# Patient Record
Sex: Male | Born: 1985 | Hispanic: No | Marital: Married | Smoking: Current every day smoker
Health system: Southern US, Community
[De-identification: ages and names within clinical notes are randomized; demographics above are authoritative.]

## PROBLEM LIST (undated history)

## (undated) DIAGNOSIS — D509 Iron deficiency anemia, unspecified: Secondary | ICD-10-CM

## (undated) DIAGNOSIS — R509 Fever, unspecified: Secondary | ICD-10-CM

## (undated) HISTORY — PX: NO PAST SURGERIES: SHX2092

---

## 2016-12-02 DIAGNOSIS — R509 Fever, unspecified: Secondary | ICD-10-CM

## 2016-12-02 HISTORY — DX: Fever, unspecified: R50.9

## 2016-12-28 ENCOUNTER — Inpatient Hospital Stay (HOSPITAL_COMMUNITY)
Admission: EM | Admit: 2016-12-28 | Discharge: 2017-01-02 | DRG: 871 | Disposition: A | Discharge status: Home / Self Care | Payer: Self-pay | Attending: Family Medicine | Admitting: Family Medicine

## 2016-12-28 ENCOUNTER — Encounter (HOSPITAL_COMMUNITY): Payer: Self-pay | Admitting: Emergency Medicine

## 2016-12-28 ENCOUNTER — Emergency Department (HOSPITAL_COMMUNITY): Payer: Self-pay

## 2016-12-28 DIAGNOSIS — D509 Iron deficiency anemia, unspecified: Secondary | ICD-10-CM | POA: Diagnosis present

## 2016-12-28 DIAGNOSIS — A419 Sepsis, unspecified organism: Principal | ICD-10-CM | POA: Diagnosis present

## 2016-12-28 DIAGNOSIS — R1010 Upper abdominal pain, unspecified: Secondary | ICD-10-CM

## 2016-12-28 DIAGNOSIS — R6521 Severe sepsis with septic shock: Secondary | ICD-10-CM | POA: Diagnosis present

## 2016-12-28 DIAGNOSIS — E871 Hypo-osmolality and hyponatremia: Secondary | ICD-10-CM | POA: Diagnosis present

## 2016-12-28 DIAGNOSIS — R110 Nausea: Secondary | ICD-10-CM

## 2016-12-28 DIAGNOSIS — R634 Abnormal weight loss: Secondary | ICD-10-CM | POA: Diagnosis present

## 2016-12-28 DIAGNOSIS — B600 Babesiosis: Secondary | ICD-10-CM | POA: Diagnosis present

## 2016-12-28 DIAGNOSIS — D696 Thrombocytopenia, unspecified: Secondary | ICD-10-CM | POA: Diagnosis present

## 2016-12-28 DIAGNOSIS — Z681 Body mass index (BMI) 19 or less, adult: Secondary | ICD-10-CM

## 2016-12-28 DIAGNOSIS — F17210 Nicotine dependence, cigarettes, uncomplicated: Secondary | ICD-10-CM | POA: Diagnosis present

## 2016-12-28 DIAGNOSIS — E876 Hypokalemia: Secondary | ICD-10-CM | POA: Diagnosis present

## 2016-12-28 DIAGNOSIS — G4700 Insomnia, unspecified: Secondary | ICD-10-CM | POA: Diagnosis present

## 2016-12-28 DIAGNOSIS — R630 Anorexia: Secondary | ICD-10-CM | POA: Diagnosis present

## 2016-12-28 DIAGNOSIS — K859 Acute pancreatitis without necrosis or infection, unspecified: Secondary | ICD-10-CM

## 2016-12-28 DIAGNOSIS — R509 Fever, unspecified: Secondary | ICD-10-CM | POA: Diagnosis present

## 2016-12-28 DIAGNOSIS — I959 Hypotension, unspecified: Secondary | ICD-10-CM | POA: Diagnosis present

## 2016-12-28 HISTORY — DX: Fever, unspecified: R50.9

## 2016-12-28 HISTORY — DX: Iron deficiency anemia, unspecified: D50.9

## 2016-12-28 LAB — CBC WITH DIFFERENTIAL/PLATELET
BASOS ABS: 0 10*3/uL (ref 0.0–0.1)
BASOS PCT: 0 %
Eosinophils Absolute: 0 10*3/uL (ref 0.0–0.7)
Eosinophils Relative: 1 %
HEMATOCRIT: 34.2 % — AB (ref 39.0–52.0)
HEMOGLOBIN: 11.6 g/dL — AB (ref 13.0–17.0)
Lymphocytes Relative: 10 %
Lymphs Abs: 0.4 10*3/uL — ABNORMAL LOW (ref 0.7–4.0)
MCH: 25.8 pg — ABNORMAL LOW (ref 26.0–34.0)
MCHC: 33.9 g/dL (ref 30.0–36.0)
MCV: 76.2 fL — ABNORMAL LOW (ref 78.0–100.0)
Monocytes Absolute: 0.3 10*3/uL (ref 0.1–1.0)
Monocytes Relative: 7 %
NEUTROS ABS: 3.4 10*3/uL (ref 1.7–7.7)
NEUTROS PCT: 82 %
Platelets: 107 10*3/uL — ABNORMAL LOW (ref 150–400)
RBC: 4.49 MIL/uL (ref 4.22–5.81)
RDW: 15.6 % — ABNORMAL HIGH (ref 11.5–15.5)
WBC: 4.2 10*3/uL (ref 4.0–10.5)

## 2016-12-28 LAB — COMPREHENSIVE METABOLIC PANEL
ALBUMIN: 3.5 g/dL (ref 3.5–5.0)
ALT: 36 U/L (ref 17–63)
ANION GAP: 9 (ref 5–15)
AST: 36 U/L (ref 15–41)
Alkaline Phosphatase: 65 U/L (ref 38–126)
BILIRUBIN TOTAL: 1.9 mg/dL — AB (ref 0.3–1.2)
BUN: 15 mg/dL (ref 6–20)
CO2: 20 mmol/L — ABNORMAL LOW (ref 22–32)
Calcium: 8.8 mg/dL — ABNORMAL LOW (ref 8.9–10.3)
Chloride: 102 mmol/L (ref 101–111)
Creatinine, Ser: 1.11 mg/dL (ref 0.61–1.24)
GFR calc Af Amer: >60 mL/min (ref >60)
GFR calc non Af Amer: >60 mL/min (ref >60)
GLUCOSE: 105 mg/dL — AB (ref 65–99)
POTASSIUM: 3.3 mmol/L — AB (ref 3.5–5.1)
Sodium: 131 mmol/L — ABNORMAL LOW (ref 135–145)
TOTAL PROTEIN: 6.5 g/dL (ref 6.5–8.1)

## 2016-12-28 LAB — URINALYSIS, ROUTINE W REFLEX MICROSCOPIC
Bilirubin Urine: NEGATIVE
Glucose, UA: NEGATIVE mg/dL
Hgb urine dipstick: NEGATIVE
KETONES UR: 5 mg/dL — AB
LEUKOCYTES UA: NEGATIVE
Nitrite: NEGATIVE
PROTEIN: NEGATIVE mg/dL
Specific Gravity, Urine: 1.009 (ref 1.005–1.030)
pH: 7 (ref 5.0–8.0)

## 2016-12-28 LAB — PROTIME-INR
INR: 1.19
Prothrombin Time: 15.1 seconds (ref 11.4–15.2)

## 2016-12-28 LAB — I-STAT CG4 LACTIC ACID, ED
LACTIC ACID, VENOUS: 1.3 mmol/L (ref 0.5–1.9)
LACTIC ACID, VENOUS: 1.56 mmol/L (ref 0.5–1.9)
LACTIC ACID, VENOUS: 0.83 mmol/L (ref 0.5–1.9)

## 2016-12-28 LAB — LIPASE, BLOOD: Lipase: 25 U/L (ref 11–51)

## 2016-12-28 MED ORDER — PIPERACILLIN-TAZOBACTAM 3.375 G IVPB
3.3750 g | Freq: Three times a day (TID) | INTRAVENOUS | Status: DC
  Administered 2016-12-29: 3.375 g via INTRAVENOUS
  Filled 2016-12-28: qty 50

## 2016-12-28 MED ORDER — VANCOMYCIN HCL IN DEXTROSE 750-5 MG/150ML-% IV SOLN
750.0000 mg | Freq: Two times a day (BID) | INTRAVENOUS | Status: DC
  Administered 2016-12-29 (×2): 750 mg via INTRAVENOUS
  Filled 2016-12-28 (×3): qty 150

## 2016-12-28 MED ORDER — VANCOMYCIN HCL 10 G IV SOLR
1500.0000 mg | Freq: Once | INTRAVENOUS | Status: AC
  Administered 2016-12-28: 1500 mg via INTRAVENOUS
  Filled 2016-12-28: qty 1500

## 2016-12-28 MED ORDER — VANCOMYCIN HCL IN DEXTROSE 1-5 GM/200ML-% IV SOLN
1000.0000 mg | Freq: Once | INTRAVENOUS | Status: DC
  Filled 2016-12-28: qty 200

## 2016-12-28 MED ORDER — SODIUM CHLORIDE 0.9 % IV BOLUS (SEPSIS)
1000.0000 mL | Freq: Once | INTRAVENOUS | Status: AC
  Administered 2016-12-28: 1000 mL via INTRAVENOUS

## 2016-12-28 MED ORDER — PIPERACILLIN-TAZOBACTAM 3.375 G IVPB 30 MIN
3.3750 g | Freq: Once | INTRAVENOUS | Status: AC
  Administered 2016-12-28: 3.375 g via INTRAVENOUS
  Filled 2016-12-28: qty 50

## 2016-12-28 MED ORDER — ACETAMINOPHEN 500 MG PO TABS
1000.0000 mg | ORAL_TABLET | Freq: Once | ORAL | Status: AC
  Administered 2016-12-28: 1000 mg via ORAL
  Filled 2016-12-28: qty 2

## 2016-12-28 MED ORDER — POTASSIUM CHLORIDE CRYS ER 20 MEQ PO TBCR
40.0000 meq | EXTENDED_RELEASE_TABLET | ORAL | Status: AC
  Administered 2016-12-28: 40 meq via ORAL
  Filled 2016-12-28: qty 2

## 2016-12-28 MED ORDER — SODIUM CHLORIDE 0.9 % IV BOLUS (SEPSIS)
250.0000 mL | Freq: Once | INTRAVENOUS | Status: AC
  Administered 2016-12-28: 250 mL via INTRAVENOUS

## 2016-12-28 NOTE — ED Triage Notes (Signed)
Pt presents c/o fevers and chills and weakness since Saturday; pt also endorses nausea; appears weak in triage unable to hold himself up, visible beads of sweat on forehead

## 2016-12-28 NOTE — ED Provider Notes (Signed)
Medical screening examination/treatment/procedure(s) were conducted as a shared visit with non-physician practitioner(s) and myself.  I personally evaluated the patient during the encounter.   EKG Interpretation None     Patient reports he has felt very ill starting new for yesterday. He started getting chills and sweats. He reports he has felt generally very weak. He denies focal abdominal pain, chest pain, urinary symptoms, rashes or tick bites. Patient endorses generalized headache no sore throat or earache. Patient denies any travel outside the [place]. He has been living in [place] for 2 years. Patient is alert and appropriate. He does not have respiratory distress. He appears moderately ill and uncomfortable. Oral cavity is pink and moist posterior oropharynx widely patent. Bilateral TMs normal. Neck is supple. Heart regular. Abdomen soft nontender. Lower extremity swelling. No joint effusions. No rash. Sepsis protocol initiated. At this time a source of infection uncertain. I agree with plan of management.   Arby Barrette, MD 12/28/16 2114

## 2016-12-28 NOTE — Progress Notes (Signed)
Pharmacy Antibiotic Note  George Murillo is a 31 y.o. male admitted on 12/28/2016 with sepsis.  Pharmacy has been consulted for vancomycin and Zosyn dosing.   Labs are now back- SCr 1.11/CrCl ~61 ml/min  Plan: Will schedule maintenance doses: Zosyn 3.375g IV every 8 hours - 4 hour infusion. Vancomycin 750mg  IV every 12 hours.  Monitor renal function, culture results, and clinical status.   Weight: 149 lb 14.6 oz (68 kg)  Temp (24hrs), Avg:101.4 F (38.6 C), Min:101.4 F (38.6 C), Max:101.4 F (38.6 C)   Recent Labs Lab 12/28/16 2018 12/28/16 2025 12/28/16 2111  WBC 4.2  --   --   CREATININE 1.11  --   --   LATICACIDVEN  --  1.30 1.56    CrCl cannot be calculated (Unknown ideal weight.).    No Known Allergies  Antimicrobials this admission: Vancomycin 8/27>> Zosyn 8/27>>  Microbiology results: 8/27 BCx:  8/27 UCx:   Thank you for allowing pharmacy to be a part of this patient's care.  Link Snuffer, PharmD, BCPS Clinical Pharmacist Clinical phone 12/28/2016 until 11PM [phone] After hours, please call #[phone] 12/28/2016 10:06 PM

## 2016-12-28 NOTE — Progress Notes (Signed)
Pharmacy Antibiotic Note  George Murillo is a 31 y.o. male admitted on 12/28/2016 with sepsis.  Pharmacy has been consulted for vancomycin and zosyn dosing. Patient is febrile. WBC count is low at 4.2 and lactate is not elevated.  Creatinine and other cultures are pending. Will follow-up with labs when result for maintenance dosing, initial and loading doses entered for now.   Plan: Vancomycin 1500 mg IV x 1 dose  Zosyn 3.375 gram IV x 1 dose  Follow cultures, renal function, LOT, and vancomycin troughs as indicated   Weight: 149 lb 14.6 oz (68 kg)  Temp (24hrs), Avg:101.4 F (38.6 C), Min:101.4 F (38.6 C), Max:101.4 F (38.6 C)   Recent Labs Lab 12/28/16 2018 12/28/16 2025  WBC 4.2  --   LATICACIDVEN  --  1.30    CrCl cannot be calculated (No order found.).    No Known Allergies  Antimicrobials this admission: Vancomycin 8/27>> Zosyn 8/27>>  Microbiology results: 8/27 BCx:  8/27 UCx:   Thank you for allowing pharmacy to be a part of this patient's care.  Blake Divine, Pharm.D. PGY1 Pharmacy Resident 12/28/2016 8:59 PM Main Pharmacy: [phone]

## 2016-12-28 NOTE — H&P (Addendum)
History and Physical    George Murillo BJY:[id number] DOB: 11-Jun-1985 DOA: 12/28/2016  Referring MD/NP/PA:Joshua Daisy Lazar PCP: Patient, No Pcp Per  Patient coming from: home  Chief Complaint: Fever and chills  HPI: George Murillo is a 31 y.o. male without significant medical history; who presents with complaints of fever and chills over the last 4 days. Patient is originally from [place], but has lived in the [place] for over 2 years. He reports having subjective fevers at home and severe cold chills with whole body shaking. Symptoms appear to waxed and waned in intensity. He tried taking cold showers and applying cool rags to his forehead with only minimal relief of symptoms. Did not try any over-the-counter medications or home remedies. Only associated symptoms that he complains of his right sided headache, body aches, and a mild dry cough. He reports no recent sick contacts, travel outside of the [place], sore throat, nasal congestion, shortness of breath, nausea, vomiting, abdominal pain, diarrhea, or dysuria. He does not smoke tobacco, drink any alcohol, or utilize any illicit drugs.  ED Course: Upon admission into the emergency department patient was seen to be febrile up to 101.48F, pulse 104-115, respirations 22-28, blood pressures as low as 85/5. The patient's initial presentation sepsis protocol was initiated with. Antibiotics of vancomycin and Zosyn and the patient was given a full fluid bolus. Despite 2.25 L of fluid blood pressures remain low and patient was ordered to begin additional 1 L of fluid. Labs revealed WBC 4.2, hemoglobin 11.6, glucose 107, sodium 131, potassium 3.3. Chest x-ray and urinalysis were negative for any signs of infection.  Review of Systems: Review of Systems  Constitutional: Positive for chills, fever, malaise/fatigue and weight loss.  HENT: Negative for congestion, ear discharge and nosebleeds.   Eyes: Negative for double vision and photophobia.  Respiratory: Positive  for cough. Negative for shortness of breath.   Cardiovascular: Negative for chest pain and leg swelling.  Gastrointestinal: Negative for abdominal pain, diarrhea, nausea and vomiting.  Genitourinary: Negative for dysuria and frequency.  Musculoskeletal: Positive for myalgias.  Skin: Negative for itching and rash.  Neurological: Positive for weakness. Negative for speech change and focal weakness.  Psychiatric/Behavioral: Negative for substance abuse and suicidal ideas.    History reviewed. No pertinent past medical history.  History reviewed. No pertinent surgical history.   reports that he has been smoking Cigarettes.  He has been smoking about 0.50 packs per day. He does not have any smokeless tobacco history on file. He reports that he does not drink alcohol. His drug history is not on file.  No Known Allergies  Family History  Problem Relation Age of Onset  . Heart disease Neg Hx     Prior to Admission medications   Not on File    Physical Exam:  Constitutional: Sick appearing young male  Vitals:   12/28/16 2100 12/28/16 2212 12/28/16 2230 12/28/16 2316  BP: (!) 94/57 (!) 88/47 (!) 87/50   Pulse: (!) 109 (!) 113 (!) 106   Resp: (!) 28 (!) 28 (!) 26   Temp:    98.4 F (36.9 C)  TempSrc:    Oral  SpO2: 99% 100% 100%   Weight:       Eyes: PERRL, lids and conjunctivae normal ENMT: Mucous membranes are dry. Posterior pharynx clear of any exudate or lesions.   Neck: normal, supple, no masses, no thyromegaly Respiratory: Tachypneic, but clear to auscultation bilaterally, no wheezing, no crackles. Normal respiratory effort. No accessory muscle use.  Cardiovascular: tachycardic, no murmurs / rubs / gallops. No extremity edema. 2+ pedal pulses. No carotid bruits.  Abdomen: no tenderness, no masses palpated. No hepatosplenomegaly. Bowel sounds positive.  Musculoskeletal: no clubbing / cyanosis. No joint deformity upper and lower extremities. Good ROM, no contractures. Normal  muscle tone.  Skin: no rashes, lesions, ulcers. No induration Neurologic: CN 2-12 grossly intact. Sensation intact, DTR normal. Strength 5/5 in all 4.  Psychiatric: Normal judgment and insight. Alert and oriented x 3. Normal mood.     Labs on Admission: I have personally reviewed following labs and imaging studies  CBC:  Recent Labs Lab 12/28/16 2018  WBC 4.2  NEUTROABS 3.4  HGB 11.6*  HCT 34.2*  MCV 76.2*  PLT 107*   Basic Metabolic Panel:  Recent Labs Lab 12/28/16 2018  NA 131*  K 3.3*  CL 102  CO2 20*  GLUCOSE 105*  BUN 15  CREATININE 1.11  CALCIUM 8.8*   GFR: CrCl cannot be calculated (Unknown ideal weight.). Liver Function Tests:  Recent Labs Lab 12/28/16 2018  AST 36  ALT 36  ALKPHOS 65  BILITOT 1.9*  PROT 6.5  ALBUMIN 3.5    Recent Labs Lab 12/28/16 2143  LIPASE 25   No results for input(s): AMMONIA in the last 168 hours. Coagulation Profile:  Recent Labs Lab 12/28/16 2018  INR 1.19   Cardiac Enzymes: No results for input(s): CKTOTAL, CKMB, CKMBINDEX, TROPONINI in the last 168 hours. BNP (last 3 results) No results for input(s): PROBNP in the last 8760 hours. HbA1C: No results for input(s): HGBA1C in the last 72 hours. CBG: No results for input(s): GLUCAP in the last 168 hours. Lipid Profile: No results for input(s): CHOL, HDL, LDLCALC, TRIG, CHOLHDL, LDLDIRECT in the last 72 hours. Thyroid Function Tests: No results for input(s): TSH, T4TOTAL, FREET4, T3FREE, THYROIDAB in the last 72 hours. Anemia Panel: No results for input(s): VITAMINB12, FOLATE, FERRITIN, TIBC, IRON, RETICCTPCT in the last 72 hours. Urine analysis:    Component Value Date/Time   COLORURINE YELLOW 12/28/2016 2210   APPEARANCEUR CLEAR 12/28/2016 2210   LABSPEC 1.009 12/28/2016 2210   PHURINE 7.0 12/28/2016 2210   GLUCOSEU NEGATIVE 12/28/2016 2210   HGBUR NEGATIVE 12/28/2016 2210   BILIRUBINUR NEGATIVE 12/28/2016 2210   KETONESUR 5 (A) 12/28/2016 2210    PROTEINUR NEGATIVE 12/28/2016 2210   NITRITE NEGATIVE 12/28/2016 2210   LEUKOCYTESUR NEGATIVE 12/28/2016 2210   Sepsis Labs: No results found for this or any previous visit (from the past 240 hour(s)).   Radiological Exams on Admission: Dg Chest 2 View  Result Date: 12/28/2016 CLINICAL DATA:  Fever chills and weakness for 2 days. EXAM: CHEST  2 VIEW COMPARISON:  None. FINDINGS: The cardiomediastinal silhouette is unremarkable. There is no evidence of focal airspace disease, pulmonary edema, suspicious pulmonary nodule/mass, pleural effusion, or pneumothorax. No acute bony abnormalities are identified. IMPRESSION: No active cardiopulmonary disease. Electronically Signed   By: Harmon Pier M.D.   On: 12/28/2016 22:17    EKG: Independently reviewed. Sinus tachycardia rate 116 bpm  Assessment/Plan Fever of unknown origin/ SIRS Acute. Patient presents febrile up to 101.71F with tachycardia and tachypnea - Admit to a stepdown bed( downgraded to telemetry bed after blood pressure stabilized) - Follow-up respiratory virus panel and blood cultures - continue antibiotics of vancomycin and Zosyn  Hypotension: Acute. Blood pressures low as 87/50 despite 2.25 L of IV fluids. Patient being given an additional 1 L of IV fluids. -  IV fluids of normal saline  at 125 ml/hr - check Cortisol level in a.m.  Hypokalemia: Acute. Initial potassium 3.3 on admission. - Give 40 mEq of potassium chloride 1 dose now - continue to monitor and replaced as needed  Microcytic microchromic anemia: Patient presents with hemoglobin 11.6 with low MCV and MCH. RDW noted to be elevated at 15.6 which also suggests signs of iron deficiency. - Check iron and TIBC in a.m. - recheck CBC  Hyponatremia: Acute. Initial sodium 131 suspect secondary to losses with elevated fevers. - Continue replacement IV fluids - Recheck BMP in a.m.   DVT prophylaxis: lovenox, and will discontinue if platlets drop below 100k.   Code Status:  Full Family Communication: Discussed plan of care with the patient and family present at bedside Disposition Plan: Likely discharge home in 2-3 days once medically stable Consults called: none  Admission status: inpatient  Clydie Braun MD [facility] Pager [phone]   If 7PM-7AM, please contact night-coverage [email or web address] Password [facility]  12/28/2016, 11:21 PM

## 2016-12-28 NOTE — ED Notes (Signed)
EDP at bedside. [place]

## 2016-12-28 NOTE — ED Notes (Signed)
Pt transported to Xray. 

## 2016-12-28 NOTE — ED Notes (Signed)
EDP at bedside. [place]

## 2016-12-28 NOTE — ED Provider Notes (Signed)
[facility]-EMERGENCY DEPT Provider Note   CSN: [id number] Arrival date & time: 12/28/16  1914     History   Chief Complaint Chief Complaint  Patient presents with  . Fever  . Weakness  . Shortness of Breath    HPI George Murillo is a 31 y.o. male.  Patient presents with complaint of fever, weakness for the past 4 days. He has had chills as well. He is from the [place] and has been in the [place] for 2 years. Denies previous illnesses. No vaccines as a child. No known sick contacts. Patient denies URI symptoms such as nasal congestion and sore throat. No significant cough or shortness of breath. He has had nausea but no vomiting or diarrhea. No abdominal pains. No urinary symptoms. No skin rashes or abscesses denies recent tick bites. Patient lives at home with his wife and children who have not been sick. He works in a [employer]. He denies alcohol or drug abuse. No treatments prior to arrival. The onset of this condition was acute. The course is constant. Aggravating factors: none. Alleviating factors: none.        History reviewed. No pertinent past medical history.  There are no active problems to display for this patient.   History reviewed. No pertinent surgical history.     Home Medications    Prior to Admission medications   Not on File    Family History History reviewed. No pertinent family history.  Social History Social History  Substance Use Topics  . Smoking status: Current Every Day Smoker    Packs/day: 0.50    Types: Cigarettes  . Smokeless tobacco: Not on file  . Alcohol use No     Allergies   Patient has no known allergies.   Review of Systems Review of Systems  Constitutional: Positive for chills and fever.  HENT: Negative for congestion, rhinorrhea and sore throat.   Eyes: Negative for redness.  Respiratory: Negative for cough, shortness of breath and wheezing.   Cardiovascular: Negative for chest pain.  Gastrointestinal: Positive for  nausea. Negative for abdominal pain, diarrhea and vomiting.  Genitourinary: Negative for dysuria and hematuria.  Musculoskeletal: Negative for back pain and myalgias.  Skin: Negative for color change and rash.  Neurological: Positive for weakness. Negative for headaches.     Physical Exam Updated Vital Signs BP (!) 86/51   Pulse (!) 115   Temp (!) 101.4 F (38.6 C) (Oral)   Resp (!) 24   Wt 68 kg (149 lb 14.6 oz)   SpO2 100%   Physical Exam  Constitutional: He appears well-developed and well-nourished.  Ill-appearing but nontoxic.  HENT:  Head: Normocephalic and atraumatic.  Nose: Nose normal.  Mouth/Throat: Oropharynx is clear and moist.  Eyes: Conjunctivae are normal. Right eye exhibits no discharge. Left eye exhibits no discharge.  Neck: Normal range of motion. Neck supple.  Cardiovascular: Regular rhythm and normal heart sounds.  Tachycardia present.   Pulmonary/Chest: Effort normal and breath sounds normal. No respiratory distress. He has no wheezes. He has no rales.  Abdominal: Soft. There is no tenderness. There is no rebound and no guarding.  Musculoskeletal: He exhibits no edema.  Generalized weakness, no focal deficits.  Lymphadenopathy:    He has no cervical adenopathy.  Neurological: He is alert.  Skin: Skin is warm and dry. No rash noted.  No identifiable rashes or skin changes.  Psychiatric: He has a normal mood and affect.  Nursing note and vitals reviewed.    ED  Treatments / Results  Labs (all labs ordered are listed, but only abnormal results are displayed) Labs Reviewed  COMPREHENSIVE METABOLIC PANEL - Abnormal; Notable for the following:       Result Value   Sodium 131 (*)    Potassium 3.3 (*)    CO2 20 (*)    Glucose, Bld 105 (*)    Calcium 8.8 (*)    Total Bilirubin 1.9 (*)    All other components within normal limits  CBC WITH DIFFERENTIAL/PLATELET - Abnormal; Notable for the following:    Hemoglobin 11.6 (*)    HCT 34.2 (*)    MCV 76.2  (*)    MCH 25.8 (*)    RDW 15.6 (*)    Platelets 107 (*)    Lymphs Abs 0.4 (*)    All other components within normal limits  URINALYSIS, ROUTINE W REFLEX MICROSCOPIC - Abnormal; Notable for the following:    Ketones, ur 5 (*)    All other components within normal limits  CULTURE, BLOOD (ROUTINE X 2)  CULTURE, BLOOD (ROUTINE X 2)  URINE CULTURE  PROTIME-INR  LIPASE, BLOOD  URINALYSIS, COMPLETE (UACMP) WITH MICROSCOPIC  RAPID HIV SCREEN (HIV 1/2 AB+AG)  RAPID URINE DRUG SCREEN, HOSP PERFORMED  TSH  I-STAT CG4 LACTIC ACID, ED  I-STAT CG4 LACTIC ACID, ED  I-STAT CG4 LACTIC ACID, ED  I-STAT CG4 LACTIC ACID, ED    EKG  EKG Interpretation None       Radiology Dg Chest 2 View  Result Date: 12/28/2016 CLINICAL DATA:  Fever chills and weakness for 2 days. EXAM: CHEST  2 VIEW COMPARISON:  None. FINDINGS: The cardiomediastinal silhouette is unremarkable. There is no evidence of focal airspace disease, pulmonary edema, suspicious pulmonary nodule/mass, pleural effusion, or pneumothorax. No acute bony abnormalities are identified. IMPRESSION: No active cardiopulmonary disease. Electronically Signed   By: Harmon Pier M.D.   On: 12/28/2016 22:17    Procedures Procedures (including critical care time)  Medications Ordered in ED Medications  vancomycin (VANCOCIN) 1,500 mg in sodium chloride 0.9 % 500 mL IVPB (1,500 mg Intravenous New Bag/Given 12/28/16 2135)  piperacillin-tazobactam (ZOSYN) IVPB 3.375 g (not administered)  vancomycin (VANCOCIN) IVPB 750 mg/150 ml premix (not administered)  sodium chloride 0.9 % bolus 1,000 mL (1,000 mLs Intravenous New Bag/Given 12/28/16 2316)  sodium chloride 0.9 % bolus 1,000 mL (0 mLs Intravenous Stopped 12/28/16 2212)    And  sodium chloride 0.9 % bolus 1,000 mL (0 mLs Intravenous Stopped 12/28/16 2212)    And  sodium chloride 0.9 % bolus 250 mL (0 mLs Intravenous Stopped 12/28/16 2113)  piperacillin-tazobactam (ZOSYN) IVPB 3.375 g (0 g Intravenous  Stopped 12/28/16 2212)  acetaminophen (TYLENOL) tablet 1,000 mg (1,000 mg Oral Given 12/28/16 2317)     Initial Impression / Assessment and Plan / ED Course  I have reviewed the triage vital signs and the nursing notes.  Pertinent labs & imaging results that were available during my care of the patient were reviewed by me and considered in my medical decision making (see chart for details).     Patient seen and examined. Work-up initiated. Medications ordered.   Vital signs reviewed and are as follows: BP (!) 86/51   Pulse (!) 115   Temp (!) 101.4 F (38.6 C) (Oral)   Resp (!) 24   Wt 68 kg (149 lb 14.6 oz)   SpO2 100%   Patient is febrile, tachycardic, hypotensive. Sepsis bundle completed. Antibiotics ordered. Fluids ordered. Will reassess. Discussed with  Dr. Donnald Garre.   11:26 PM Patient feels better. Temp improved. Still hypotensive -- additional fluids ordered.   Spoke with Dr. Katrinka Blazing of [facility] who will see.   Final Clinical Impressions(s) / ED Diagnoses   Final diagnoses:  Fever, unspecified fever cause  Sepsis, due to unspecified organism (HCC)   Admit. Unclear cause of fever/sepsis, tachycardia, hypotension.   New Prescriptions New Prescriptions   No medications on file     Desmond Dike 12/28/16 2328    Arby Barrette, MD 12/29/16 [phone]

## 2016-12-29 ENCOUNTER — Encounter (HOSPITAL_COMMUNITY): Payer: Self-pay | Admitting: General Practice

## 2016-12-29 DIAGNOSIS — I959 Hypotension, unspecified: Secondary | ICD-10-CM | POA: Diagnosis present

## 2016-12-29 DIAGNOSIS — E871 Hypo-osmolality and hyponatremia: Secondary | ICD-10-CM | POA: Diagnosis present

## 2016-12-29 DIAGNOSIS — D509 Iron deficiency anemia, unspecified: Secondary | ICD-10-CM | POA: Diagnosis present

## 2016-12-29 DIAGNOSIS — R509 Fever, unspecified: Secondary | ICD-10-CM | POA: Diagnosis present

## 2016-12-29 LAB — CBC
HEMATOCRIT: 32.7 % — AB (ref 39.0–52.0)
Hemoglobin: 10.3 g/dL — ABNORMAL LOW (ref 13.0–17.0)
MCH: 24.4 pg — AB (ref 26.0–34.0)
MCHC: 31.5 g/dL (ref 30.0–36.0)
MCV: 77.5 fL — AB (ref 78.0–100.0)
PLATELETS: 96 10*3/uL — AB (ref 150–400)
RBC: 4.22 MIL/uL (ref 4.22–5.81)
RDW: 15.5 % (ref 11.5–15.5)
WBC: 4.9 10*3/uL (ref 4.0–10.5)

## 2016-12-29 LAB — RAPID URINE DRUG SCREEN, HOSP PERFORMED
AMPHETAMINES: NOT DETECTED
Barbiturates: NOT DETECTED
Benzodiazepines: NOT DETECTED
Cocaine: NOT DETECTED
Opiates: NOT DETECTED
Tetrahydrocannabinol: NOT DETECTED

## 2016-12-29 LAB — HEPATIC FUNCTION PANEL
ALBUMIN: 2.6 g/dL — AB (ref 3.5–5.0)
ALT: 25 U/L (ref 17–63)
AST: 25 U/L (ref 15–41)
Alkaline Phosphatase: 44 U/L (ref 38–126)
BILIRUBIN TOTAL: 0.8 mg/dL (ref 0.3–1.2)
Bilirubin, Direct: 0.2 mg/dL (ref 0.1–0.5)
Indirect Bilirubin: 0.6 mg/dL (ref 0.3–0.9)
Total Protein: 5.1 g/dL — ABNORMAL LOW (ref 6.5–8.1)

## 2016-12-29 LAB — CBC WITH DIFFERENTIAL/PLATELET
BASOS ABS: 0 10*3/uL (ref 0.0–0.1)
Basophils Relative: 1 %
EOS ABS: 0.1 10*3/uL (ref 0.0–0.7)
EOS PCT: 2 %
HCT: 31.6 % — ABNORMAL LOW (ref 39.0–52.0)
Hemoglobin: 10.2 g/dL — ABNORMAL LOW (ref 13.0–17.0)
LYMPHS PCT: 39 %
Lymphs Abs: 1.7 10*3/uL (ref 0.7–4.0)
MCH: 25 pg — ABNORMAL LOW (ref 26.0–34.0)
MCHC: 32.3 g/dL (ref 30.0–36.0)
MCV: 77.5 fL — AB (ref 78.0–100.0)
Monocytes Absolute: 0.5 10*3/uL (ref 0.1–1.0)
Monocytes Relative: 11 %
NEUTROS PCT: 48 %
Neutro Abs: 2.1 10*3/uL (ref 1.7–7.7)
PLATELETS: 88 10*3/uL — AB (ref 150–400)
RBC: 4.08 MIL/uL — ABNORMAL LOW (ref 4.22–5.81)
RDW: 15.7 % — ABNORMAL HIGH (ref 11.5–15.5)
WBC: 4.4 10*3/uL (ref 4.0–10.5)

## 2016-12-29 LAB — BASIC METABOLIC PANEL
Anion gap: 5 (ref 5–15)
BUN: 8 mg/dL (ref 6–20)
CALCIUM: 7.4 mg/dL — AB (ref 8.9–10.3)
CO2: 21 mmol/L — AB (ref 22–32)
CREATININE: 1.02 mg/dL (ref 0.61–1.24)
Chloride: 112 mmol/L — ABNORMAL HIGH (ref 101–111)
GFR calc Af Amer: >60 mL/min (ref >60)
GLUCOSE: 125 mg/dL — AB (ref 65–99)
Potassium: 3.5 mmol/L (ref 3.5–5.1)
Sodium: 138 mmol/L (ref 135–145)

## 2016-12-29 LAB — RESPIRATORY PANEL BY PCR
ADENOVIRUS-RVPPCR: NOT DETECTED
Bordetella pertussis: NOT DETECTED
CHLAMYDOPHILA PNEUMONIAE-RVPPCR: NOT DETECTED
CORONAVIRUS HKU1-RVPPCR: NOT DETECTED
CORONAVIRUS NL63-RVPPCR: NOT DETECTED
CORONAVIRUS OC43-RVPPCR: NOT DETECTED
Coronavirus 229E: NOT DETECTED
Influenza A: NOT DETECTED
Influenza B: NOT DETECTED
Metapneumovirus: NOT DETECTED
Mycoplasma pneumoniae: NOT DETECTED
PARAINFLUENZA VIRUS 1-RVPPCR: NOT DETECTED
PARAINFLUENZA VIRUS 3-RVPPCR: NOT DETECTED
Parainfluenza Virus 2: NOT DETECTED
Parainfluenza Virus 4: NOT DETECTED
RHINOVIRUS / ENTEROVIRUS - RVPPCR: NOT DETECTED
Respiratory Syncytial Virus: NOT DETECTED

## 2016-12-29 LAB — IRON AND TIBC
Iron: 13 ug/dL — ABNORMAL LOW (ref 45–182)
SATURATION RATIOS: 6 % — AB (ref 17.9–39.5)
TIBC: 200 ug/dL — AB (ref 250–450)
UIBC: 187 ug/dL

## 2016-12-29 LAB — RAPID HIV SCREEN (HIV 1/2 AB+AG)
HIV 1/2 ANTIBODIES: NONREACTIVE
HIV-1 P24 ANTIGEN - HIV24: NONREACTIVE

## 2016-12-29 LAB — CORTISOL-AM, BLOOD: CORTISOL - AM: 8.8 ug/dL (ref 6.7–22.6)

## 2016-12-29 LAB — TSH: TSH: 1.172 u[IU]/mL (ref 0.350–4.500)

## 2016-12-29 MED ORDER — HYDROCORTISONE NA SUCCINATE PF 100 MG IJ SOLR
100.0000 mg | Freq: Three times a day (TID) | INTRAMUSCULAR | Status: DC
  Administered 2016-12-29 – 2016-12-30 (×3): 100 mg via INTRAVENOUS
  Filled 2016-12-29 (×4): qty 2

## 2016-12-29 MED ORDER — ONDANSETRON HCL 4 MG/2ML IJ SOLN
4.0000 mg | Freq: Four times a day (QID) | INTRAMUSCULAR | Status: DC | PRN
  Administered 2017-01-01: 4 mg via INTRAVENOUS
  Filled 2016-12-29: qty 2

## 2016-12-29 MED ORDER — ACETAMINOPHEN 650 MG RE SUPP: 650.0000 mg | Freq: Four times a day (QID) | RECTAL | Status: DC | PRN

## 2016-12-29 MED ORDER — SODIUM CHLORIDE 0.9 % IV SOLN
INTRAVENOUS | Status: DC
  Administered 2016-12-29: 22:00:00 via INTRAVENOUS
  Administered 2016-12-29: 200 mL/h via INTRAVENOUS
  Administered 2016-12-29: 125 mL/h via INTRAVENOUS
  Administered 2016-12-30 – 2017-01-01 (×5): via INTRAVENOUS

## 2016-12-29 MED ORDER — ACETAMINOPHEN 325 MG PO TABS
650.0000 mg | ORAL_TABLET | Freq: Four times a day (QID) | ORAL | Status: DC | PRN
  Administered 2016-12-29: 650 mg via ORAL
  Filled 2016-12-29 (×2): qty 2

## 2016-12-29 MED ORDER — ONDANSETRON HCL 4 MG PO TABS: 4.0000 mg | ORAL_TABLET | Freq: Four times a day (QID) | ORAL | Status: DC | PRN

## 2016-12-29 MED ORDER — PIPERACILLIN-TAZOBACTAM 3.375 G IVPB
3.3750 g | Freq: Three times a day (TID) | INTRAVENOUS | Status: DC
  Administered 2016-12-29 – 2016-12-30 (×3): 3.375 g via INTRAVENOUS
  Filled 2016-12-29 (×4): qty 50

## 2016-12-29 MED ORDER — SODIUM CHLORIDE 0.9 % IV BOLUS (SEPSIS)
1000.0000 mL | Freq: Once | INTRAVENOUS | Status: AC
  Administered 2016-12-29: 1000 mL via INTRAVENOUS

## 2016-12-29 MED ORDER — ENOXAPARIN SODIUM 40 MG/0.4ML ~~LOC~~ SOLN: 40.0000 mg | SUBCUTANEOUS | Status: DC

## 2016-12-29 NOTE — Progress Notes (Signed)
I agree with Dr. 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 No recent outside travel Because of religion does not use illicit substances nor smoke   On admit septic with septic shock He does not have multiorgan dysfunction syndrome   A./P Septic shock  Compensating well, map is in the 60s--stable for stepdown unit, start Cortef 100 mg every 8 and wean as tolerated in the next 1-2 days  At this juncture do not feel given his well appearance and overt lack of symptoms doesn't need involvement from critical care at this time   however I will check back on him during the day and if he seems to be decompensating we will involve them than   Continue normal saline at 200 cc per hour up from 1 25 cc per hour  We will repeat labs in the morning  Continue broad-spectrum vancomycin and Zosyn  Feel that thrombocytopenia is secondary to his sepsis in addition to his mild anemia secondary to volume repletion I have a very low suspicion for meningitis and he is already improving and feels better than when he first came in

## 2016-12-29 NOTE — ED Notes (Signed)
Attmpted to call report

## 2016-12-30 ENCOUNTER — Encounter (HOSPITAL_COMMUNITY): Payer: Self-pay | Admitting: Radiology

## 2016-12-30 ENCOUNTER — Inpatient Hospital Stay (HOSPITAL_COMMUNITY): Payer: Self-pay

## 2016-12-30 LAB — COMPREHENSIVE METABOLIC PANEL
ALBUMIN: 3 g/dL — AB (ref 3.5–5.0)
ALK PHOS: 46 U/L (ref 38–126)
ALK PHOS: 53 U/L (ref 38–126)
ALT: 33 U/L (ref 17–63)
ALT: 32 U/L (ref 17–63)
AST: 24 U/L (ref 15–41)
AST: 29 U/L (ref 15–41)
Albumin: 2.6 g/dL — ABNORMAL LOW (ref 3.5–5.0)
Anion gap: 6 (ref 5–15)
Anion gap: 5 (ref 5–15)
BUN: 6 mg/dL (ref 6–20)
BUN: 7 mg/dL (ref 6–20)
CALCIUM: 8.1 mg/dL — AB (ref 8.9–10.3)
CALCIUM: 8.3 mg/dL — AB (ref 8.9–10.3)
CHLORIDE: 110 mmol/L (ref 101–111)
CO2: 23 mmol/L (ref 22–32)
CO2: 24 mmol/L (ref 22–32)
CREATININE: 0.8 mg/dL (ref 0.61–1.24)
CREATININE: 0.92 mg/dL (ref 0.61–1.24)
Chloride: 109 mmol/L (ref 101–111)
GFR calc Af Amer: >60 mL/min (ref >60)
GFR calc non Af Amer: >60 mL/min (ref >60)
GFR calc non Af Amer: >60 mL/min (ref >60)
GLUCOSE: 81 mg/dL (ref 65–99)
Glucose, Bld: 138 mg/dL — ABNORMAL HIGH (ref 65–99)
Potassium: 3.5 mmol/L (ref 3.5–5.1)
Potassium: 3.6 mmol/L (ref 3.5–5.1)
SODIUM: 139 mmol/L (ref 135–145)
SODIUM: 138 mmol/L (ref 135–145)
Total Bilirubin: 0.5 mg/dL (ref 0.3–1.2)
Total Bilirubin: 0.8 mg/dL (ref 0.3–1.2)
Total Protein: 5.5 g/dL — ABNORMAL LOW (ref 6.5–8.1)
Total Protein: 6.4 g/dL — ABNORMAL LOW (ref 6.5–8.1)

## 2016-12-30 LAB — CBC WITH DIFFERENTIAL/PLATELET
BASOS PCT: 0 %
BASOS PCT: 0 %
Basophils Absolute: 0 10*3/uL (ref 0.0–0.1)
Basophils Absolute: 0 10*3/uL (ref 0.0–0.1)
EOS ABS: 0 10*3/uL (ref 0.0–0.7)
EOS ABS: 0.1 10*3/uL (ref 0.0–0.7)
EOS PCT: 1 %
Eosinophils Relative: 2 %
HCT: 31.6 % — ABNORMAL LOW (ref 39.0–52.0)
HCT: 36.7 % — ABNORMAL LOW (ref 39.0–52.0)
Hemoglobin: 10.3 g/dL — ABNORMAL LOW (ref 13.0–17.0)
Hemoglobin: 11.7 g/dL — ABNORMAL LOW (ref 13.0–17.0)
LYMPHS ABS: 1 10*3/uL (ref 0.7–4.0)
Lymphocytes Relative: 24 %
Lymphocytes Relative: 19 %
Lymphs Abs: 0.9 10*3/uL (ref 0.7–4.0)
MCH: 25.2 pg — AB (ref 26.0–34.0)
MCH: 25.2 pg — ABNORMAL LOW (ref 26.0–34.0)
MCHC: 32.6 g/dL (ref 30.0–36.0)
MCHC: 31.9 g/dL (ref 30.0–36.0)
MCV: 77.5 fL — ABNORMAL LOW (ref 78.0–100.0)
MCV: 78.9 fL (ref 78.0–100.0)
MONO ABS: 0.3 10*3/uL (ref 0.1–1.0)
MONO ABS: 0.1 10*3/uL (ref 0.1–1.0)
MONOS PCT: 6 %
MONOS PCT: 2 %
Neutro Abs: 2.9 10*3/uL (ref 1.7–7.7)
Neutro Abs: 3.6 10*3/uL (ref 1.7–7.7)
Neutrophils Relative %: 69 %
Neutrophils Relative %: 77 %
PLATELETS: 113 10*3/uL — AB (ref 150–400)
Platelets: 101 10*3/uL — ABNORMAL LOW (ref 150–400)
RBC: 4.08 MIL/uL — ABNORMAL LOW (ref 4.22–5.81)
RBC: 4.65 MIL/uL (ref 4.22–5.81)
RDW: 16.3 % — AB (ref 11.5–15.5)
RDW: 16.2 % — AB (ref 11.5–15.5)
WBC: 4.2 10*3/uL (ref 4.0–10.5)
WBC: 4.7 10*3/uL (ref 4.0–10.5)

## 2016-12-30 LAB — URINE CULTURE: Culture: NO GROWTH

## 2016-12-30 LAB — LIPASE, BLOOD: Lipase: 27 U/L (ref 11–51)

## 2016-12-30 MED ORDER — MORPHINE SULFATE (PF) 2 MG/ML IV SOLN
2.0000 mg | INTRAVENOUS | Status: DC | PRN
Start: 2016-12-30 — End: 2016-12-31
  Administered 2016-12-30: 2 mg via INTRAVENOUS
  Filled 2016-12-30: qty 1

## 2016-12-30 MED ORDER — ACETAMINOPHEN 325 MG PO TABS
650.0000 mg | ORAL_TABLET | Freq: Four times a day (QID) | ORAL | Status: DC | PRN
  Administered 2016-12-31 – 2017-01-01 (×3): 650 mg via ORAL
  Filled 2016-12-30 (×4): qty 2

## 2016-12-30 MED ORDER — IOPAMIDOL (ISOVUE-300) INJECTION 61%
INTRAVENOUS | Status: AC
  Administered 2016-12-30: 100 mL
  Filled 2016-12-30: qty 100

## 2016-12-30 MED ORDER — VANCOMYCIN 50 MG/ML ORAL SOLUTION
125.0000 mg | Freq: Four times a day (QID) | ORAL | Status: DC
  Administered 2016-12-30 – 2016-12-31 (×4): 125 mg via ORAL
  Filled 2016-12-30 (×6): qty 2.5

## 2016-12-30 MED ORDER — HYDROCORTISONE NA SUCCINATE PF 100 MG IJ SOLR: 100.0000 mg | Freq: Two times a day (BID) | INTRAMUSCULAR | Status: DC

## 2016-12-30 MED ORDER — PIPERACILLIN-TAZOBACTAM 3.375 G IVPB
3.3750 g | Freq: Three times a day (TID) | INTRAVENOUS | Status: DC
  Administered 2016-12-30 – 2017-01-01 (×5): 3.375 g via INTRAVENOUS
  Filled 2016-12-30 (×6): qty 50

## 2016-12-30 MED ORDER — GI COCKTAIL ~~LOC~~
30.0000 mL | Freq: Two times a day (BID) | ORAL | Status: DC | PRN
  Administered 2016-12-30: 30 mL via ORAL
  Filled 2016-12-30: qty 30

## 2016-12-30 NOTE — Care Management Note (Addendum)
Case Management Note  Patient Details  Name: Roxana HiresMagdi Elms MRN: [id number] Date of Birth: 1985/12/29  Subjective/Objective:                Admitted with fever of unknown origin. From home with wife/children. Pt without health insurance.  Independent with ADL's, no DME usage PTA.  Andree CossAli Mohamed Ali (Friend)     [phone]      PCP: Brent GeneralNONE....Marland Kitchen.pt  [facility]   Action/Plan: Pt is to call on 01/06/2017 @ 9:00am to arrange post hospital follow up appointment and [facility] . CM made pt aware and noted on AVS. CM will assist with Match Letter @ d/c if needed for Rx medications.  Pt will return to home when medically stable. CM will continue to follow for disposition needs.   Expected Discharge Date:    12/31/2016           Expected Discharge Plan:     In-House Referral:     Discharge planning Services     Post Acute Care Choice:    Choice offered to:     DME Arranged:    DME Agency:     HH Arranged:    HH Agency:     Status of Service:     If discussed at [employer] Meetings, dates discussed:    Additional Comments:  Epifanio LeschesCole, Aleyah Balik Hudson, RN 12/30/2016, 11:11 AM

## 2016-12-30 NOTE — Progress Notes (Signed)
My read Ct abd? SBO as layering fluid--on scout unclear if twisting of Colon?? Await official read-Rn states pain better Alerted overnight Md to check in on Patient   George KochJai Quadarius Henton, MD [facility] [phone]) [phone]

## 2016-12-30 NOTE — Progress Notes (Signed)
Notified MD of CT results. Orders to hold NG tube unless Pt has increased nausea or vomiting. Pt resting in bed. Pain decreased after morphine. Denies needs at this time. Family at bedside. Will cont to monitor.

## 2016-12-30 NOTE — Progress Notes (Signed)
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 No recent outside travel Because of religion does not use illicit substances nor smoke     On admit septic with septic shock He does not have multiorgan dysfunction syndrome    Assessment & Plan:   Principal Problem:   Fever of unknown origin Active Problems:   Sepsis (HCC)   Hypotension   Microcytic anemia   Hyponatremia   Septic shock-unclear etiology              initially started on broad-spectrum vancomycin and Zosyn which was discontinued on the morning of 8/29 after 2 days of dosing   He has not had any further fevers or recurrences  Given her drop in his platelets are feel this may been a viral etiology but I'm not sure if that-respiratory final panel was negative  Abdominal pain  Had sudden 10/10 pain after eating 30 minutes after lunchtime prior-GI cocktail to be trialed--did not make any difference  Blood ulture X2, lipase, CBC, LFT., will obtain a stat abdominal CT scan and reassess and maybe put him back on Zosyn once results are known  given his episodic diarrhea very reasonable to get GI pathogen panel and empirically start on by mouth vancomycin and this can be discontinued if C. difficile panel is negative   Headaches  Probably component of viral illness  Anorexia and 20 kg weight loss  States that when he moved from [place] because of stress, insomnia and poor sleep he seems to have lost unintentionally 20 kg.    DVT prophylaxis:  lovenox Code Status:  full Family Communication:  D/w wife at bedsdie Disposition Plan:  unclear   Consultants:   None yet  Procedures:   None   Antimicrobials:   Zosyn  Oral vanc    Subjective: Patient seen earlier this morning looking well no fever no chills tolerating diet Reviewed this afternoon after developed shaking chills severe 10/10 abdominal pain and full body myalgia He states that the pain is all over but primarily in epigastrium and groin areas His wife is at the bedside and confirms that he has had a possible ureteral calculus about 8-10 years ago but this does not feel to the patient like it was urinary and he has had no blood in his urine He does report 4 episodes of diarrhea yesterday and 3 today which were watery and unformed-he only tells me this now despite me asking him yesterday and this morning if there were any other symptoms Not nauseous but significantly uncomfortable appearing  Objective: Vitals:   12/30/16 1450 12/30/16 1507 12/30/16 1552 12/30/16 1556  BP: 97/63  (!) 144/123 (!) 144/123  Pulse: 83  (!) 124 (!) 131  Resp: 18     Temp: 98.8 F (37.1 C) 98.5 F (36.9 C) 98.2 F (36.8 C)   TempSrc: Oral Oral Oral   SpO2: 100%  100% 99%  Weight:      Height:  Intake/Output Summary (Last 24 hours) at 12/30/16 1601 Last data filed at 12/30/16 0700  Gross per 24 hour  Intake             2840 ml  Output             2550 ml  Net              290 ml   Filed Weights   12/28/16 2005 12/29/16 0832 12/29/16 1524  Weight: 68 kg (149 lb 14.6 oz) 68 kg (149 lb 14.6 oz) 68 kg (149 lb 14.6 oz)    Examination:  Looks unwell Feels warm to touch Shaking chills Tachycardic 120s S1-S2 no murmur No McBurney's point tenderness Mild epigastric tenderness bilateral tenderness in lower quadrants however no focal finding-voluntarily is guarding Lower extremities nonswollen   Data Reviewed: I have personally reviewed following labs and imaging  studies  CBC:  Recent Labs Lab 12/28/16 2018 12/29/16 0405 12/29/16 0953 12/30/16 0535  WBC 4.2 4.9 4.4 4.2  NEUTROABS 3.4  --  2.1 2.9  HGB 11.6* 10.3* 10.2* 10.3*  HCT 34.2* 32.7* 31.6* 31.6*  MCV 76.2* 77.5* 77.5* 77.5*  PLT 107* 96* 88* 113*   Basic Metabolic Panel:  Recent Labs Lab 12/28/16 2018 12/29/16 0405 12/30/16 0535  NA 131* 138 139  K 3.3* 3.5 3.5  CL 102 112* 110  CO2 20* 21* 23  GLUCOSE 105* 125* 138*  BUN 15 8 6   CREATININE 1.11 1.02 0.80  CALCIUM 8.8* 7.4* 8.1*   GFR: Estimated Creatinine Clearance: 127.5 mL/min (by C-G formula based on SCr of 0.8 mg/dL). Liver Function Tests:  Recent Labs Lab 12/28/16 2018 12/29/16 0405 12/30/16 0535  AST 36 25 24  ALT 36 25 33  ALKPHOS 65 44 46  BILITOT 1.9* 0.8 0.5  PROT 6.5 5.1* 5.5*  ALBUMIN 3.5 2.6* 2.6*    Recent Labs Lab 12/28/16 2143  LIPASE 25   No results for input(s): AMMONIA in the last 168 hours. Coagulation Profile:  Recent Labs Lab 12/28/16 2018  INR 1.19   Cardiac Enzymes: No results for input(s): CKTOTAL, CKMB, CKMBINDEX, TROPONINI in the last 168 hours. BNP (last 3 results) No results for input(s): PROBNP in the last 8760 hours. HbA1C: No results for input(s): HGBA1C in the last 72 hours. CBG: No results for input(s): GLUCAP in the last 168 hours. Lipid Profile: No results for input(s): CHOL, HDL, LDLCALC, TRIG, CHOLHDL, LDLDIRECT in the last 72 hours. Thyroid Function Tests:  Recent Labs  12/28/16 2325  TSH 1.172   Anemia Panel:  Recent Labs  12/29/16 0405  TIBC 200*  IRON 13*   Urine analysis:    Component Value Date/Time   COLORURINE YELLOW 12/28/2016 2210   APPEARANCEUR CLEAR 12/28/2016 2210   LABSPEC 1.009 12/28/2016 2210   PHURINE 7.0 12/28/2016 2210   GLUCOSEU NEGATIVE 12/28/2016 2210   HGBUR NEGATIVE 12/28/2016 2210   BILIRUBINUR NEGATIVE 12/28/2016 2210   KETONESUR 5 (A) 12/28/2016 2210   PROTEINUR NEGATIVE 12/28/2016 2210   NITRITE  NEGATIVE 12/28/2016 2210   LEUKOCYTESUR NEGATIVE 12/28/2016 2210     Radiology Studies: Reviewed images personally in health database    Scheduled Meds: . hydrocortisone sod succinate (SOLU-CORTEF) inj  100 mg Intravenous Q12H   Continuous Infusions: . sodium chloride 75 mL/hr at 12/30/16 1042     LOS: 2 days    Time spent: 8445    Pleas KochJai Perle Gibbon, MD [facility] (P) [phone]   If 7PM-7AM, please contact night-coverage  [email or web address] Password [email or web address] 12/30/2016, 4:01 PM

## 2016-12-31 ENCOUNTER — Inpatient Hospital Stay (HOSPITAL_COMMUNITY): Payer: Self-pay

## 2016-12-31 LAB — C DIFFICILE QUICK SCREEN W PCR REFLEX
C DIFFICILE (CDIFF) INTERP: NOT DETECTED
C DIFFICILE (CDIFF) TOXIN: NEGATIVE
C DIFFICLE (CDIFF) ANTIGEN: NEGATIVE

## 2016-12-31 LAB — COMPREHENSIVE METABOLIC PANEL
ALT: 22 U/L (ref 17–63)
AST: 15 U/L (ref 15–41)
Albumin: 2.4 g/dL — ABNORMAL LOW (ref 3.5–5.0)
Alkaline Phosphatase: 39 U/L (ref 38–126)
Anion gap: 4 — ABNORMAL LOW (ref 5–15)
BUN: 7 mg/dL (ref 6–20)
CHLORIDE: 109 mmol/L (ref 101–111)
CO2: 26 mmol/L (ref 22–32)
Calcium: 7.8 mg/dL — ABNORMAL LOW (ref 8.9–10.3)
Creatinine, Ser: 1.1 mg/dL (ref 0.61–1.24)
Glucose, Bld: 95 mg/dL (ref 65–99)
POTASSIUM: 3.2 mmol/L — AB (ref 3.5–5.1)
Sodium: 139 mmol/L (ref 135–145)
Total Bilirubin: 0.7 mg/dL (ref 0.3–1.2)
Total Protein: 5.2 g/dL — ABNORMAL LOW (ref 6.5–8.1)

## 2016-12-31 LAB — GASTROINTESTINAL PANEL BY PCR, STOOL (REPLACES STOOL CULTURE)
ADENOVIRUS F40/41: NOT DETECTED
ASTROVIRUS: NOT DETECTED
CRYPTOSPORIDIUM: NOT DETECTED
Campylobacter species: NOT DETECTED
Cyclospora cayetanensis: NOT DETECTED
ENTEROAGGREGATIVE E COLI (EAEC): NOT DETECTED
ENTEROPATHOGENIC E COLI (EPEC): NOT DETECTED
Entamoeba histolytica: NOT DETECTED
Enterotoxigenic E coli (ETEC): NOT DETECTED
GIARDIA LAMBLIA: NOT DETECTED
Norovirus GI/GII: NOT DETECTED
Plesimonas shigelloides: NOT DETECTED
ROTAVIRUS A: NOT DETECTED
Salmonella species: NOT DETECTED
Sapovirus (I, II, IV, and V): NOT DETECTED
Shiga like toxin producing E coli (STEC): NOT DETECTED
Shigella/Enteroinvasive E coli (EIEC): NOT DETECTED
Vibrio cholerae: NOT DETECTED
Vibrio species: NOT DETECTED
YERSINIA ENTEROCOLITICA: NOT DETECTED

## 2016-12-31 LAB — LACTOFERRIN, FECAL, QUALITATIVE: Lactoferrin, Fecal, Qual: POSITIVE — AB

## 2016-12-31 LAB — CBC WITH DIFFERENTIAL/PLATELET
BASOS ABS: 0 10*3/uL (ref 0.0–0.1)
Basophils Relative: 0 %
EOS PCT: 2 %
Eosinophils Absolute: 0.1 10*3/uL (ref 0.0–0.7)
HCT: 32 % — ABNORMAL LOW (ref 39.0–52.0)
Hemoglobin: 10.2 g/dL — ABNORMAL LOW (ref 13.0–17.0)
LYMPHS ABS: 1.5 10*3/uL (ref 0.7–4.0)
LYMPHS PCT: 40 %
MCH: 24.8 pg — AB (ref 26.0–34.0)
MCHC: 31.9 g/dL (ref 30.0–36.0)
MCV: 77.7 fL — AB (ref 78.0–100.0)
MONO ABS: 0.4 10*3/uL (ref 0.1–1.0)
Monocytes Relative: 9 %
Neutro Abs: 1.9 10*3/uL (ref 1.7–7.7)
Neutrophils Relative %: 49 %
PLATELETS: 87 10*3/uL — AB (ref 150–400)
RBC: 4.12 MIL/uL — ABNORMAL LOW (ref 4.22–5.81)
RDW: 16.4 % — AB (ref 11.5–15.5)
WBC: 3.8 10*3/uL — ABNORMAL LOW (ref 4.0–10.5)

## 2016-12-31 LAB — HIV ANTIBODY (ROUTINE TESTING W REFLEX): HIV SCREEN 4TH GENERATION: NONREACTIVE

## 2016-12-31 LAB — LIPASE, BLOOD: LIPASE: 29 U/L (ref 11–51)

## 2016-12-31 NOTE — Progress Notes (Signed)
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              initially started on broad-spectrum vancomycin and Zosyn which was discontinued on the morning of 8/29 after 2 days of dosing   Given her drop in his platelets are feel this may been a viral etiology?  Resp panel neg, GI path panel pending  Abdominal pain  Had sudden 10/10 pain after eating 30 minutes after lunchtime prior-GI cocktail to be trialed--did not make any difference  Blood culture X2, lipase, CBC, LFT--ALL WNL CT abd 8/29=? Pericholecystic fluid?pancreatitis Await [place] abd performed 8.30.18 Might need GI input   Headaches  Probably component of viral illness  Anorexia and 20 kg weight loss  States that when he moved from [place] because of stress, insomnia and poor sleep he seems to have lost unintentionally 20 kg.    DVT prophylaxis: lovenox Code Status:  full Family Communication:  D/w wife at bedsdie Disposition Plan:  unclear   Consultants:   None yet  Procedures:   None   Antimicrobials:   Zosyn  Oral vanc 8/29-stopped 8/30   Subjective:  Awake alert  eating soft diet ealier in nad No n   Objective: Vitals:   12/30/16 1556 12/30/16 2240 12/31/16 0612 12/31/16 1453  BP: (!) 144/123 (!) 91/45 (!) 89/55 (!) 94/58  Pulse: (!) 131 (!) 105 88 82  Resp:  20 16 18   Temp:  99.9 F (37.7 C) 98.9 F (37.2 C)   TempSrc:  Oral Oral   SpO2: 99% 96% 98% 99%  Weight:      Height:        Intake/Output Summary (Last 24 hours) at 12/31/16 1735 Last data filed at 12/31/16 1500  Gross per 24 hour  Intake           3012.5 ml  Output              400 ml  Net           2612.5 ml   Filed Weights   12/28/16 2005 12/29/16 0832 12/29/16 1524  Weight: 68 kg (149 lb 14.6 oz) 68 kg (149 lb 14.6 oz) 68 kg (149 lb 14.6 oz)    Examination:  Better Tired No pallor s1 s 2no m epig tender++ No mcburney pain bs deceased  Data Reviewed: I have personally reviewed following labs and imaging studies  CBC:  Recent Labs Lab 12/28/16 2018 12/29/16 0405 12/29/16 0953 12/30/16 0535 12/30/16 1652 12/31/16 1021  WBC 4.2 4.9 4.4 4.2 4.7 3.8*  NEUTROABS 3.4  --  2.1 2.9 3.6 1.9  HGB 11.6* 10.3* 10.2* 10.3* 11.7* 10.2*  HCT 34.2* 32.7* 31.6* 31.6* 36.7* 32.0*  MCV 76.2* 77.5* 77.5* 77.5* 78.9 77.7*  PLT 107* 96* 88* 113* 101* 87*   Basic Metabolic Panel:  Recent Labs Lab 12/28/16 2018 12/29/16 0405 12/30/16 0535 12/30/16 1608 12/31/16 1021  NA 131* 138 139 138 139  K 3.3* 3.5 3.5 3.6 3.2*  CL 102 112* 110 109 109  CO2 20* 21* 23 24 26   GLUCOSE 105* 125* 138* 81 95  BUN 15 8 6 7 7   CREATININE 1.11 1.02 0.80 0.92 1.10  CALCIUM 8.8* 7.4* 8.1* 8.3* 7.8*   GFR: Estimated Creatinine Clearance: 92.7 mL/min (by C-G formula based on SCr of 1.1 mg/dL). Liver Function Tests:  Recent Labs Lab 12/28/16 2018 12/29/16 0405 12/30/16 0535 12/30/16 1608 12/31/16 1021  AST 36 25 24 29 15   ALT 36 25 33 32 22  ALKPHOS 65 44 46 53 39  BILITOT 1.9* 0.8 0.5 0.8 0.7  PROT 6.5 5.1* 5.5* 6.4* 5.2*  ALBUMIN 3.5 2.6* 2.6* 3.0* 2.4*    Recent  Labs Lab 12/28/16 2143 12/30/16 1608 12/31/16 1021  LIPASE 25 27 29    No results for input(s): AMMONIA in the last 168 hours. Coagulation Profile:  Recent Labs Lab 12/28/16 2018  INR 1.19   Cardiac Enzymes: No results for input(s): CKTOTAL, CKMB, CKMBINDEX, TROPONINI in the last 168 hours. BNP (last 3 results) No results for input(s): PROBNP in the last 8760 hours. HbA1C: No results for input(s): HGBA1C in the last 72 hours. CBG: No results for input(s): GLUCAP in the last 168 hours. Lipid Profile: No results for input(s): CHOL, HDL, LDLCALC, TRIG, CHOLHDL, LDLDIRECT in the last 72 hours. Thyroid Function Tests:  Recent Labs  12/28/16 2325  TSH 1.172   Anemia Panel:  Recent Labs  12/29/16 0405  TIBC 200*  IRON 13*   Urine analysis:    Component Value Date/Time   COLORURINE YELLOW 12/28/2016 2210   APPEARANCEUR CLEAR 12/28/2016 2210   LABSPEC 1.009 12/28/2016 2210   PHURINE 7.0 12/28/2016 2210   GLUCOSEU NEGATIVE 12/28/2016 2210   HGBUR NEGATIVE 12/28/2016 2210   BILIRUBINUR NEGATIVE 12/28/2016 2210   KETONESUR 5 (A) 12/28/2016 2210   PROTEINUR NEGATIVE 12/28/2016 2210   NITRITE NEGATIVE 12/28/2016 2210   LEUKOCYTESUR NEGATIVE 12/28/2016 2210     Radiology Studies: Reviewed images personally in health database    Scheduled Meds:  Continuous Infusions: . sodium chloride 75 mL/hr at 12/31/16 1331  . piperacillin-tazobactam (ZOSYN)  IV Stopped (12/31/16 1353)     LOS: 3 days    Time spent: 3645    [name], MD [facility](P) [phone]   If 7PM-7AM, please contact night-coverage [email or web address] Password [email or web address] 12/31/2016, 5:35 PM

## 2017-01-01 DIAGNOSIS — A419 Sepsis, unspecified organism: Principal | ICD-10-CM

## 2017-01-01 DIAGNOSIS — D509 Iron deficiency anemia, unspecified: Secondary | ICD-10-CM

## 2017-01-01 DIAGNOSIS — B530 Plasmodium ovale malaria: Secondary | ICD-10-CM

## 2017-01-01 DIAGNOSIS — R1010 Upper abdominal pain, unspecified: Secondary | ICD-10-CM

## 2017-01-01 DIAGNOSIS — R509 Fever, unspecified: Secondary | ICD-10-CM

## 2017-01-01 DIAGNOSIS — R110 Nausea: Secondary | ICD-10-CM

## 2017-01-01 DIAGNOSIS — F17210 Nicotine dependence, cigarettes, uncomplicated: Secondary | ICD-10-CM

## 2017-01-01 LAB — HEPATIC FUNCTION PANEL
ALT: 21 U/L (ref 17–63)
AST: 18 U/L (ref 15–41)
Albumin: 2.6 g/dL — ABNORMAL LOW (ref 3.5–5.0)
Alkaline Phosphatase: 39 U/L (ref 38–126)
BILIRUBIN DIRECT: 0.2 mg/dL (ref 0.1–0.5)
BILIRUBIN TOTAL: 0.8 mg/dL (ref 0.3–1.2)
Indirect Bilirubin: 0.6 mg/dL (ref 0.3–0.9)
Total Protein: 5.9 g/dL — ABNORMAL LOW (ref 6.5–8.1)

## 2017-01-01 LAB — CBC WITH DIFFERENTIAL/PLATELET
BASOS ABS: 0 10*3/uL (ref 0.0–0.1)
BASOS PCT: 0 %
Basophils Absolute: 0 10*3/uL (ref 0.0–0.1)
Basophils Relative: 0 %
EOS ABS: 0.1 10*3/uL (ref 0.0–0.7)
EOS ABS: 0 10*3/uL (ref 0.0–0.7)
Eosinophils Relative: 1 %
Eosinophils Relative: 1 %
HCT: 33.8 % — ABNORMAL LOW (ref 39.0–52.0)
HEMATOCRIT: 33.2 % — AB (ref 39.0–52.0)
HEMOGLOBIN: 10.9 g/dL — AB (ref 13.0–17.0)
HEMOGLOBIN: 10.8 g/dL — AB (ref 13.0–17.0)
LYMPHS ABS: 0.5 10*3/uL — AB (ref 0.7–4.0)
Lymphocytes Relative: 15 %
Lymphocytes Relative: 17 %
Lymphs Abs: 0.7 10*3/uL (ref 0.7–4.0)
MCH: 24.9 pg — ABNORMAL LOW (ref 26.0–34.0)
MCH: 25 pg — ABNORMAL LOW (ref 26.0–34.0)
MCHC: 32.2 g/dL (ref 30.0–36.0)
MCHC: 32.5 g/dL (ref 30.0–36.0)
MCV: 77.3 fL — ABNORMAL LOW (ref 78.0–100.0)
MCV: 76.9 fL — ABNORMAL LOW (ref 78.0–100.0)
MONOS PCT: 6 %
Monocytes Absolute: 0.3 10*3/uL (ref 0.1–1.0)
Monocytes Absolute: 0.2 10*3/uL (ref 0.1–1.0)
Monocytes Relative: 5 %
NEUTROS ABS: 3.7 10*3/uL (ref 1.7–7.7)
NEUTROS ABS: 2.4 10*3/uL (ref 1.7–7.7)
NEUTROS PCT: 78 %
NEUTROS PCT: 77 %
Platelets: 110 10*3/uL — ABNORMAL LOW (ref 150–400)
Platelets: 86 10*3/uL — ABNORMAL LOW (ref 150–400)
RBC: 4.37 MIL/uL (ref 4.22–5.81)
RBC: 4.32 MIL/uL (ref 4.22–5.81)
RDW: 16.5 % — ABNORMAL HIGH (ref 11.5–15.5)
RDW: 16.5 % — ABNORMAL HIGH (ref 11.5–15.5)
WBC: 4.7 10*3/uL (ref 4.0–10.5)
WBC: 3.2 10*3/uL — ABNORMAL LOW (ref 4.0–10.5)

## 2017-01-01 LAB — OCCULT BLOOD X 1 CARD TO LAB, STOOL: FECAL OCCULT BLD: NEGATIVE

## 2017-01-01 LAB — PARASITE EXAM SCREEN, BLOOD-W CONF TO LABCORP (NOT @ ARMC)

## 2017-01-01 MED ORDER — AMOXICILLIN-POT CLAVULANATE 875-125 MG PO TABS
1.0000 | ORAL_TABLET | Freq: Two times a day (BID) | ORAL | Status: DC
  Administered 2017-01-01: 1 via ORAL
  Filled 2017-01-01: qty 1

## 2017-01-01 MED ORDER — IBUPROFEN 600 MG PO TABS
600.0000 mg | ORAL_TABLET | ORAL | Status: DC | PRN
  Administered 2017-01-01: 600 mg via ORAL
  Filled 2017-01-01: qty 1

## 2017-01-01 MED ORDER — PIPERACILLIN-TAZOBACTAM 3.375 G IVPB
3.3750 g | Freq: Three times a day (TID) | INTRAVENOUS | Status: DC
  Administered 2017-01-01: 3.375 g via INTRAVENOUS
  Filled 2017-01-01 (×2): qty 50

## 2017-01-01 MED ORDER — PIPERACILLIN-TAZOBACTAM 3.375 G IVPB
3.3750 g | Freq: Three times a day (TID) | INTRAVENOUS | Status: DC
  Filled 2017-01-01: qty 50

## 2017-01-01 MED ORDER — AZITHROMYCIN 500 MG PO TABS
500.0000 mg | ORAL_TABLET | Freq: Once | ORAL | Status: AC
  Administered 2017-01-01: 500 mg via ORAL
  Filled 2017-01-01: qty 1

## 2017-01-01 MED ORDER — PIPERACILLIN-TAZOBACTAM 3.375 G IVPB 30 MIN
3.3750 g | Freq: Once | INTRAVENOUS | Status: DC
  Filled 2017-01-01: qty 50

## 2017-01-01 MED ORDER — AZITHROMYCIN 500 MG PO TABS
250.0000 mg | ORAL_TABLET | Freq: Every day | ORAL | Status: DC
  Administered 2017-01-02: 250 mg via ORAL
  Filled 2017-01-01: qty 1

## 2017-01-01 MED ORDER — DOXYCYCLINE HYCLATE 100 MG IV SOLR
100.0000 mg | Freq: Two times a day (BID) | INTRAVENOUS | Status: DC
  Administered 2017-01-01: 100 mg via INTRAVENOUS
  Filled 2017-01-01: qty 100

## 2017-01-01 MED ORDER — ATOVAQUONE 750 MG/5ML PO SUSP
750.0000 mg | Freq: Two times a day (BID) | ORAL | Status: DC
  Administered 2017-01-01 – 2017-01-02 (×3): 750 mg via ORAL
  Filled 2017-01-01 (×4): qty 5

## 2017-01-01 NOTE — Consult Note (Signed)
[facility] Consult: 12:08 PM 01/01/2017  LOS: 4 days    Referring Provider: Dr Mahala MenghiniSamtani  Primary Care Physician:  Patient, No Pcp Per Primary Gastroenterologist:  Gentry FitzUnassigned.       Reason for Consultation:  Right upper quadrant abdominal pain, nausea, non-bloody diarrhea.   HPI: George Murillo is a 31 y.o. male emigrated from [place] 2016.  No significant PMH or surgeries.  He works for a [employer].  Patient admitted 4 days ago with a 3 day history of upper abdominal pain, primarily in the right upper quadrant, nausea without emesis, watery/nonbloody diarrhea, fever, chills, malaise, severe weakness. Temp measured 101.4 F Amitted with septic shock, fever unknown origin.  Hemoglobin 11.6 >> 10.2 >> 10.9.  Low MCV at 76. Iron, iron sat,   Thrombocytopenia, initially 1 1 3, today 86 K.  WBCs consistently WNL.   coags normal. Lipase, LFTs normal though Albumiin and T protein low.    Stool C. difficile negative, lactoferrin +.   Stool has not been tested for FOB. HIV negative. Blood and urine cultures both negative thus far. UA unremarkable. CXR negative.    After lunch yesterday, had 10/10 abdominal pain, no relief with GI Cocktail.   12/30/16 CT abdomen: Periportal and pericholecystic edema.  Fluid in right peri-colic gutter.  Central mesentery looks "wispy.Marland Kitchen. which may be seen with acute pancreatitis in addition to mesenteric edema".  Abnormal nodes at porta hepatis, and shotty retroperitoneal lymph nodes.  "Somewhat featureless appearance of the visualized portions of the colon, usually associated with inflammatory changes".  Small bil pleural effusions and atx.   12/31/16 abd ultrasound: 1. Contracted gallbladder with wall thickness of 7 mm. This wall thickening may be partially accounted for by the  contracted state of the gallbladder, however gallbladder wall edema is suspected given the degree of thickening. Small amount of pericholecystic fluid which may be reactive or due to presence of small amount of ascites.  Somewhat featureless appearance of the visualized portion of the pancreas, may represent parenchymal edema. Normal liver.  Small perihepatic ascites.  Bilateral pleural effusions.  Today the fever returned with readings of 103 F.  White blood cell count is still not elevated. Thrombocytopenia is worse.  Abdominal pain and nausea continues. He feels terrible. No shortness of breath or cough. However, a deep breath exacerbates pain in RUQ.   Day 5 Zosyn.  Day 1 IV doxycycline.  Received IV vancomycin 8/27 - 8/228 and oral vanc 8/29-8/30. Received a single dose of Augmentin this morning but it is now discontinued. Ordered today and pending are a blood smear for parasites, rheumatoid factor, Anti-DNA Ab,    Prior to recent onset of symptoms, the patient had no GI issues, no hematologic issues, took no meds. Was able to eat, weight had been stable. No sick contacts at home or work.  He doesn't drink alcoholic beverages. Family history negative for anemia, intestinal bleeding, ulcers, gallbladder disease, liver disease, pancreatic disease.    Past Medical History:  Diagnosis Date  . Fever of unknown origin 12/2016  . Microcytic anemia  Past Surgical History:  Procedure Laterality Date  . NO PAST SURGERIES      Prior to Admission medications   Not on File    Scheduled Meds: . amoxicillin-clavulanate  1 tablet Oral Q12H   Infusions: . sodium chloride 75 mL/hr at 01/01/17 0223   PRN Meds: acetaminophen, gi cocktail, ondansetron **OR** ondansetron (ZOFRAN) IV   Allergies as of 12/28/2016  . (No Known Allergies)    Family History  Problem Relation Age of Onset  . Heart disease Neg Hx     Social History   Social History  . Marital status: Married    Spouse  name: N/A  . Number of children: N/A  . Years of education: N/A   Occupational History  . Not on file.   Social History Main Topics  . Smoking status: Current Every Day Smoker    Packs/day: 0.50    Types: Cigarettes  . Smokeless tobacco: Never Used  . Alcohol use No  . Drug use: No  . Sexual activity: Not on file   Other Topics Concern  . Not on file   Social History Narrative  . No narrative on file    REVIEW OF SYSTEMS: Constitutional:  Weakness and malaise. + diaphoresis.   ENT:  No nose bleeds Pulm:  Shortness of breath or cough. CV:  No palpitations, no LE edema. No chest pain. GU:  No hematuria, no frequency GI:  Per HPI Heme:  No unusual or excessive bleeding/bruising.   Transfusions:  None Neuro:  No headaches, no peripheral tingling or numbness Derm:  No itching, no rash or sores.  Endocrine:  No sweats or chills.  No polyuria or dysuria Immunization:  Not queried.   Travel:  None beyond local counties in last few months.    PHYSICAL EXAM: Vital signs in last 24 hours: Vitals:   01/01/17 0534 01/01/17 0630  BP: 113/66   Pulse: (!) 101   Resp:    Temp: (!) 101.8 F (38.8 C) 99.5 F (37.5 C)  SpO2: 98%    Wt Readings from Last 3 Encounters:  12/29/16 68 kg (149 lb 14.6 oz)    General: Ill, somewhat toxic appearing man, somewhat thin but not cachectic. Head:  No facial swelling or asymmetry. No signs of head trauma.  Eyes:  No scleral icterus, no conjunctival pallor. Ears:  Not hard of hearing  Nose:  No congestion or discharge. Mouth:  Oropharynx moist, clear. Tongue midline. Neck:  No JVD, no masses, no thyromegaly Lungs:  Somewhat diminished but clear bilaterally. Dry cough. Heart: Regular, tachycardic. S1, S2 present. No MRG. Abdomen:  Soft, thin. Nondistended. No organomegaly. Tender in the right upper quadrant. Bowel sounds normal but hypoactive, no tinkling or tympanitic bowel sounds..   Rectal: Deferred   Musc/Skeltl: No joint erythema,  swelling or gross deformities. Extremities:  No CCE.  Neurologic:  A bit drowsy but easily awakened. Moves all 4 limbs. No tremor. No limb weakness. No gross deficits. Skin:  No jaundice, no sores, no rashes, no telangiectasia. No suspicious lesions. Tattoos:  None observed Nodes:  No cervical or inguinal adenopathy.   Psych:  Cooperative, pleasant, calm.  Intake/Output from previous day: 08/30 0701 - 08/31 0700 In: 875 [I.V.:825; IV Piggyback:50] Out: 400 [Urine:400] Intake/Output this shift: No intake/output data recorded.  LAB RESULTS:  Recent Labs  12/30/16 1652 12/31/16 1021 01/01/17 0911  WBC 4.7 3.8* 4.7  HGB 11.7* 10.2* 10.9*  HCT 36.7* 32.0* 33.8*  PLT 101* 87* 110*  BMET Lab Results  Component Value Date   NA 139 12/31/2016   NA 138 12/30/2016   NA 139 12/30/2016   K 3.2 (L) 12/31/2016   K 3.6 12/30/2016   K 3.5 12/30/2016   CL 109 12/31/2016   CL 109 12/30/2016   CL 110 12/30/2016   CO2 26 12/31/2016   CO2 24 12/30/2016   CO2 23 12/30/2016   GLUCOSE 95 12/31/2016   GLUCOSE 81 12/30/2016   GLUCOSE 138 (H) 12/30/2016   BUN 7 12/31/2016   BUN 7 12/30/2016   BUN 6 12/30/2016   CREATININE 1.10 12/31/2016   CREATININE 0.92 12/30/2016   CREATININE 0.80 12/30/2016   CALCIUM 7.8 (L) 12/31/2016   CALCIUM 8.3 (L) 12/30/2016   CALCIUM 8.1 (L) 12/30/2016   LFT  Recent Labs  12/30/16 1608 12/31/16 1021 01/01/17 0911  PROT 6.4* 5.2* 5.9*  ALBUMIN 3.0* 2.4* 2.6*  AST 29 15 18   ALT 32 22 21  ALKPHOS 53 39 39  BILITOT 0.8 0.7 0.8  BILIDIR  --   --  0.2  IBILI  --   --  0.6   PT/INR Lab Results  Component Value Date   INR 1.19 12/28/2016   Hepatitis Panel No results for input(s): HEPBSAG, HCVAB, HEPAIGM, HEPBIGM in the last 72 hours. C-Diff No components found for: CDIFF Lipase     Component Value Date/Time   LIPASE 29 12/31/2016 1021    Drugs of Abuse     Component Value Date/Time   LABOPIA NONE DETECTED 12/28/2016 2219    COCAINSCRNUR NONE DETECTED 12/28/2016 2219   LABBENZ NONE DETECTED 12/28/2016 2219   AMPHETMU NONE DETECTED 12/28/2016 2219   THCU NONE DETECTED 12/28/2016 2219   LABBARB NONE DETECTED 12/28/2016 2219     RADIOLOGY STUDIES: Ct Abdomen W Contrast  Result Date: 12/30/2016 CLINICAL DATA:  Abdominal pain for 4 days. Clinically suspected gastroenteritis/colitis. EXAM: CT ABDOMEN WITH CONTRAST TECHNIQUE: Multidetector CT imaging of the abdomen was performed using the standard protocol following bolus administration of intravenous contrast. CONTRAST:  100 cc Isovue 300 intravenously. COMPARISON:  None. FINDINGS: Lower chest: Small bilateral pleural effusions with associated dependent subsegmental atelectasis. Hepatobiliary: Mild perivascular edema. The gallbladder is collapsed and therefore suboptimally evaluated. There is however pericholecystic edema. Pancreas: Unremarkable. No pancreatic ductal dilatation or surrounding inflammatory changes. Spleen: Normal in size without focal abnormality.  Splenial noted. Adrenals/Urinary Tract: Adrenal glands are unremarkable. Kidneys are normal, without renal calculi, focal lesion, or hydronephrosis. Bladder is unremarkable. Stomach/Bowel: Stomach is within normal limits. Appendix not entirely visualized due to collimation. No evidence of small bowel wall thickening, distention, or inflammatory changes, where visualized. Somewhat featureless appearance of the visualized portion of the colon. Vascular/Lymphatic: No significant vascular findings are present. Enlarged porta hepatitis lymph node measuring 17 mm in short axis. Shotty retroperitoneal lymph nodes in the upper abdomen with wispy appearance of the mesentery. Other: Small amount of free fluid in the abdomen, mainly perihepatic and within the right pericolic gutter. Musculoskeletal: No acute or significant osseous findings. IMPRESSION: Periportal and pericholecystic edema. Please correlate to liver function tests to  exclude intrinsic liver disease. Small amount of free fluid in the abdomen, mostly in perihepatic location and along the right pericolic gutter. Wispy appearance of the central mesentery of the upper abdomen, which may be seen with acute pancreatitis in addition to mesenteric edema. Abnormal lymph node in porta hepatis measures 1.7 cm in short axis. Other shotty retroperitoneal lymph nodes in the upper abdomen. Somewhat featureless appearance  of the visualized portions of the colon, usually associated with inflammatory changes. Bilateral small pleural effusions and subsegmental dependent atelectasis. Electronically Signed   By: Ted Mcalpine M.D.   On: 12/30/2016 19:21   US Abdomen Complete  Result Date: 12/31/2016 CLINICAL DATA:  Pancreatitis.  Epigastric pain for 4 days. EXAM: ABDOMEN ULTRASOUND COMPLETE COMPARISON:  CT of the abdomen and pelvis 12/30/2016 FINDINGS: Gallbladder: The gallbladder is contracted and therefore suboptimally visualized. There is an apparent thickening of the gallbladder wall measuring 7 mm. There is a small amount of pericholecystic fluid. The sonographic Murphy's sign was reported by the sonographer as negative. Common bile duct: Diameter: 2.2 mm Liver: No focal lesion identified. Within normal limits in parenchymal echogenicity. Portal vein is patent on color Doppler imaging with normal direction of blood flow towards the liver. IVC: No abnormality visualized. Pancreas: Somewhat featureless appearance but normal thickness. Spleen: Size and appearance within normal limits. 3 cm splenule noted. Right Kidney: Length: 12.7 cm. Echogenicity within normal limits. No mass or hydronephrosis visualized. Left Kidney: Length: 13.3 cm. Echogenicity within normal limits. No mass or hydronephrosis visualized. Abdominal aorta: No aneurysm in the visualized portion of the aorta. Other findings: Perihepatic ascites. Bilateral pleural effusions. IMPRESSION: 1. Contracted gallbladder with wall  thickness of 7 mm. This wall thickening may be partially accounted for by the contracted state of the gallbladder, however gallbladder wall edema is suspected given the degree of thickening. Small amount of pericholecystic fluid which may be reactive or due to presence of small amount of ascites. 2. Somewhat featureless appearance of the visualized portion of the pancreas, may represent parenchymal edema. 3. Normal appearance of the liver. 4. Small amount of perihepatic ascites. 5. Bilateral pleural effusions. Electronically Signed   By: Ted Mcalpine M.D.   On: 12/31/2016 17:56     IMPRESSION:   *  Right upper quadrant abdominal pain with nausea and fever.  Between CT abdomen and ultrasound of abdomen suspicion for cholecystitis.  However not clear that cholecystitis can explain the adenopathy at the porta hepatis and retroperitoneum.  CT also raises question of colitis and Stool lactoferrin is +.  .  *  Microcytic anemia. Iron, TIBC, iron saturation all decreased.  Patient has not had any overt bleeding.  Stool FOBT not yet checked. Patient's ethnic origin, Afro-Arab from the [place], raises suspicion for thalassemia or other blood dyscrasias.    PLAN:     *  HIDA scan and/or general surgical opinion re: ? Diseased GB.    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  No need of HIDA or surgical eval.    Jennye Moccasin  01/01/2017, 12:08 PM Pager: [phone]

## 2017-01-01 NOTE — Progress Notes (Signed)
Patient reported new onset of nausea and chills. Temp 103.2.  Given tylenol and zofran.  Temp only dropped to 103.0, MD notified.  Will continue to monitor.

## 2017-01-01 NOTE — Progress Notes (Signed)
Pharmacy Antibiotic Note George Murillo is a 31 y.o. male admitted on 12/28/2016 with concern for IAA. Pharmacy has been consulted for Zosyn dosing.  Plan: 1. Zosyn 3.375g IV q8h (4 hour infusion).  2. Will follow peripherally.  Height: 6' 2.8" (190 cm) Weight: 149 lb 14.6 oz (68 kg) IBW/kg (Calculated) : 84.05  Temp (24hrs), Avg:99.9 F (37.7 C), Min:98.4 F (36.9 C), Max:101.8 F (38.8 C)   Recent Labs Lab 12/28/16 2018 12/28/16 2025 12/28/16 2111 12/28/16 2334 12/29/16 0405 12/29/16 0953 12/30/16 0535 12/30/16 1608 12/30/16 1652 12/31/16 1021 01/01/17 0911  WBC 4.2  --   --   --  4.9 4.4 4.2  --  4.7 3.8* 4.7  CREATININE 1.11  --   --   --  1.02  --  0.80 0.92  --  1.10  --   LATICACIDVEN  --  1.30 1.56 0.83  --   --   --   --   --   --   --     Estimated Creatinine Clearance: 92.7 mL/min (by C-G formula based on SCr of 1.1 mg/dL).    No Known Allergies   Thank you for allowing pharmacy to be a part of this patient's care.  Pollyann SamplesAndy Banner Huckaba, PharmD, BCPS 01/01/2017, 1:08 PM

## 2017-01-01 NOTE — Progress Notes (Signed)
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 TCP              initially started on broad-spectrum vancomycin and Zosyn which was discontinued on the morning of 8/29 after 2 days of dosing   Was resumed with transient doxy  [place] appears +  ID input appreciated  Abdominal pain  Had sudden 10/10 pain after eating 30 minutes after lunchtime prior-GI cocktail to be trialed--did not make any difference  Blood culture X2, lipase, CBC, LFT--ALL WNL CT abd 8/29=? Pericholecystic fluid?pancreatitis [place] abd unremarkeable Appreciate GI--holding HIDa for now   Headaches  Probably component of viral illness  Anorexia and 20 kg weight loss  States that when he moved from [place] because of stress, insomnia and poor sleep he seems to have lost unintentionally 20 kg.    DVT prophylaxis: lovenox Code Status:  full Family Communication:  D/w wife at bedsdie Disposition Plan:  unclear   Consultants:   None yet  Procedures:   None   Antimicrobials:   Zosyn  Oral vanc 8/29-stopped 8/30   Subjective:  tmax  103 Shaking chills and malaise    Objective: Vitals:   01/01/17 1400 01/01/17 1500 01/01/17 1511 01/01/17 1515  BP:   (!) 86/57 (!) 92/43  Pulse:   (!) 115 (!) 119  Resp:   20 20  Temp: (!) 102.1 F (38.9 C) (!) 103.2 F (39.6 C)    TempSrc: Oral     SpO2:   95% 96%  Weight:      Height:        Intake/Output Summary (Last 24 hours) at 01/01/17 1658 Last data filed at 01/01/17 1540  Gross per 24 hour  Intake             2100 ml  Output             1300 ml  Net              800 ml   Filed Weights   12/28/16 2005 12/29/16 0832 12/29/16 1524  Weight: 68 kg (149 lb 14.6 oz) 68 kg (149 lb 14.6 oz) 68 kg (149 lb 14.6 oz)    Examination:  Looks ill Warm to touch abd tender epig No le edema  Data Reviewed: I have personally reviewed following labs and imaging studies  CBC:  Recent Labs Lab 12/30/16 0535 12/30/16 1652 12/31/16 1021 01/01/17 0911 01/01/17 1314  WBC 4.2 4.7 3.8* 4.7 3.2*  NEUTROABS 2.9 3.6 1.9 3.7 2.4  HGB 10.3* 11.7* 10.2* 10.9* 10.8*  HCT  31.6* 36.7* 32.0* 33.8* 33.2*  MCV 77.5* 78.9 77.7* 77.3* 76.9*  PLT 113* 101* 87* 110* 86*   Basic Metabolic Panel:  Recent Labs Lab 12/28/16 2018 12/29/16 0405 12/30/16 0535 12/30/16 1608 12/31/16 1021  NA 131* 138 139 138 139  K 3.3* 3.5 3.5 3.6 3.2*  CL 102 112* 110 109 109  CO2 20* 21* 23 24 26   GLUCOSE 105* 125* 138* 81 95  BUN 15 8 6 7 7   CREATININE 1.11 1.02 0.80 0.92 1.10  CALCIUM 8.8* 7.4* 8.1* 8.3* 7.8*   GFR: Estimated Creatinine Clearance: 92.7 mL/min (by C-G formula based on SCr of 1.1 mg/dL). Liver Function Tests:  Recent Labs Lab 12/29/16 0405 12/30/16 0535 12/30/16 1608 12/31/16 1021 01/01/17 0911  AST 25 24 29 15 18   ALT 25 33 32 22 21  ALKPHOS 44 46 53 39 39  BILITOT 0.8 0.5 0.8 0.7 0.8  PROT 5.1* 5.5* 6.4* 5.2* 5.9*  ALBUMIN 2.6* 2.6* 3.0* 2.4* 2.6*    Recent Labs Lab 12/28/16 2143 12/30/16 1608 12/31/16 1021  LIPASE 25 27 29    No results for input(s):  AMMONIA in the last 168 hours. Coagulation Profile:  Recent Labs Lab 12/28/16 2018  INR 1.19   Cardiac Enzymes: No results for input(s): CKTOTAL, CKMB, CKMBINDEX, TROPONINI in the last 168 hours. BNP (last 3 results) No results for input(s): PROBNP in the last 8760 hours. HbA1C: No results for input(s): HGBA1C in the last 72 hours. CBG: No results for input(s): GLUCAP in the last 168 hours. Lipid Profile: No results for input(s): CHOL, HDL, LDLCALC, TRIG, CHOLHDL, LDLDIRECT in the last 72 hours. Thyroid Function Tests: No results for input(s): TSH, T4TOTAL, FREET4, T3FREE, THYROIDAB in the last 72 hours. Anemia Panel: No results for input(s): VITAMINB12, FOLATE, FERRITIN, TIBC, IRON, RETICCTPCT in the last 72 hours. Urine analysis:    Component Value Date/Time   COLORURINE YELLOW 12/28/2016 2210   APPEARANCEUR CLEAR 12/28/2016 2210   LABSPEC 1.009 12/28/2016 2210   PHURINE 7.0 12/28/2016 2210   GLUCOSEU NEGATIVE 12/28/2016 2210   HGBUR NEGATIVE 12/28/2016 2210   BILIRUBINUR NEGATIVE 12/28/2016 2210   KETONESUR 5 (A) 12/28/2016 2210   PROTEINUR NEGATIVE 12/28/2016 2210   NITRITE NEGATIVE 12/28/2016 2210   LEUKOCYTESUR NEGATIVE 12/28/2016 2210     Radiology Studies: Reviewed images personally in health database    Scheduled Meds:  Continuous Infusions: . sodium chloride 125 mL/hr at 01/01/17 1502     LOS: 4 days    Time spent: 25    Pleas Koch, MD [facility] (P) [phone]   If 7PM-7AM, please contact night-coverage [email or web address] Password [facility] 01/01/2017, 4:58 PM

## 2017-01-01 NOTE — Consult Note (Addendum)
[facility]    Date of Admission:  12/28/2016     Attestation: I have seen and examined [name] and discussed his unusual case with my medical student, Raoul PitchNeha Verma. I am in agreement with her assessment and plan. [name] immigrated [place] the [place] from [place] 2 years ago. He has not traveled abroad since that time. He works for a [employer] [place] He has been in very good health throughout his life until about one week ago when he had sudden onset of high fevers, chills headache and diffuse pain leading to admission several days ago. He has had no improvement with broad empiric antibiotic therapy. He began having some nausea, vomiting and diarrhea after admission. CT scan and ultrasound has shown some localized perihepatic and mesenteric edema. He has small bilateral pleural effusions but no pneumonia. Today blood smears have appeared to show red blood cell inclusions. I reviewed the slides with Dr. Baruch MerlJosh Kish. 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 This would be extremely late for a relapse of malaria. His smear is being sent to [facility] for review. I will order serum PCR for Babesia and start him on treatment with azithromycin and atovaquone.  Cliffton AstersJohn Alanson Hausmann, MD [facility] [phone] pager   [phone] cell 01/01/2017, 5:08 PM         Total days of antibiotics: 5        Vancomycin 12/28/16 - 12/30/16        Day 5 of Pip-Tazo      Reason for Consult: Fever of unknown origin    Referring Provider: Sueanne MargaritaJai-Gurmukh Samanti, M.D.  Assessment: [name] is a pleasant 31 year old male with no known past medical history who presented on 12/28/16 with waxing and waning fevers of unknown origin with associated symptoms of headache, arthralgias/myalgias, abdominal pain, and diarrhea, as well as microcytic anemia and thrombocytopenia. He had an extensive negative work-up as detailed  below, including two sets of negative blood cultures. 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   Babesiosis, while not frequently seen in [place], would be more plausible than malaria, given that [name] has not returned to [place] in two years and a recurrence of malaria would be rare. He denies any recent tick bites, but may have had a bite that went unnoticed. We will consider sending PCR assays as well as Babesia serologies in an attempt to confirm this diagnosis, but will plan to go ahead and initiate treatment, likely with with azithomycin + atovaquone for 7-10 days, with close monitoring for resolution of his symptoms.  Plan: 1. Consider sending PCR assays as well as Babesia serologies. 2. Initiate treatment, likely with azithromycin + atovaquone, with close monitoring for resolution of symptoms.  Principal Problem:   Fever of unknown origin Active Problems:   Sepsis (HCC)   Hypotension   Microcytic anemia   Hyponatremia  HPI: [name] is a pleasant 31 year old male with no known past medical history who presented on 12/28/16 with a four-day history of daily, waxing and waning, fevers/chills accompanied by headache, arthralgias/myalgias, epigastric abdominal pain, and diarrhea. On admission he was febrile and hypotensive. He was initially started on vancomycin and pip-tazo; vanc discontinued on 12/30/16. Work-up thus far has been significant  for negative/normal UA and culture, BC x2 (drawn 12/28/16 and 12/30/16), RVP, HIV, lipase, LFTs, a.m. cortisol, C. diff, stool GI panel by PCR, and CXR. Fecal lactoferrin was positive. He also has a microcytic anemia and is leukopenic and thrombocytopenic. Abdominal CT revealed periportal and pericholecystic edema, and wispy appearance of the central mesentery of the upper  abdomen. Abdominal ultrasound revealed contracted gallbladder with wall thickening with small amount of pericholecystic fluid, possible parenchymal edema of the pancreas, and small amount of perihepatic ascites. 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  Mr. Kulzer primary complaint at this time is fevers/chills and nausea. He states that he does not feel any better than when he initially came in to the hospital. He is from [place], but claims that he has been in the [place] for the past two years with no recent travel outside of [place]. He has no sick contacts. He works at [employer]. He does not have a history of IVDU. He denies recent insect/tick bites.  Review of Systems: Review of Systems  Constitutional: Positive for chills, fever, malaise/fatigue and weight loss.  HENT: Negative for congestion, ear pain, sore throat and tinnitus.   Eyes: Negative for blurred vision, double vision and photophobia.  Respiratory: Negative for cough, sputum production and shortness of breath.   Cardiovascular: Negative for chest pain, palpitations and leg swelling.  Gastrointestinal: Positive for abdominal pain, diarrhea and nausea. Negative for blood in stool, constipation and vomiting.  Genitourinary: Negative for dysuria.  Musculoskeletal: Positive for joint pain and myalgias.  Skin: Negative for itching and rash.  Neurological: Positive for weakness and headaches. Negative for dizziness.   Past Medical History:  Diagnosis Date  . Fever of unknown origin 12/2016  . Microcytic anemia     Social History  Substance Use Topics  . Smoking status: Current Every Day Smoker    Packs/day: 0.50    Types: Cigarettes  . Smokeless tobacco: Never Used  . Alcohol use No    Family History  Problem Relation Age of Onset  . Heart  disease Neg Hx    No Known Allergies  OBJECTIVE: Blood pressure (!) 92/43, pulse (!) 119, temperature (!) 103.2 F (39.6 C), resp. rate 20, height 6' 2.8" (1.9 m), weight 68 kg (149 lb 14.6 oz), SpO2 96 %.  Physical Exam  General: Ill-appearing male curled up in bed. HEENT: Moist mucus membranes. No cervical adenopathy. Cardiac: Regular rate and rhythm. No murmurs appreciated. Respiratory: Normal work of breathing on room air. Clear to auscultation bilaterally. Abdomen: Soft, non-distended. Tender to palpation over the epigastrium without rebound or guarding. Extremities: Warm and dry. No lower extremity edema.   Lab Results Lab Results  Component Value Date   WBC 3.2 (L) 01/01/2017   HGB 10.8 (L) 01/01/2017   HCT 33.2 (L) 01/01/2017   MCV 76.9 (L) 01/01/2017   PLT 86 (L) 01/01/2017    Lab Results  Component Value Date   CREATININE 1.10 12/31/2016   BUN 7 12/31/2016   NA 139 12/31/2016   K 3.2 (L) 12/31/2016   CL 109 12/31/2016   CO2 26 12/31/2016    Lab Results  Component Value Date   ALT 21 01/01/2017   AST 18 01/01/2017   ALKPHOS 39 01/01/2017   BILITOT 0.8 01/01/2017     Microbiology: Recent Results (from the past 240 hour(s))  Culture, blood (Routine x 2)     Status: None (Preliminary result)   Collection Time: 12/28/16  8:13 PM  Result Value Ref Range Status   Specimen Description BLOOD RIGHT ANTECUBITAL  Final   Special Requests   Final    BOTTLES DRAWN AEROBIC AND ANAEROBIC Blood Culture adequate volume   Culture NO GROWTH 4 DAYS  Final   Report Status PENDING  Incomplete  Culture, blood (Routine x 2)     Status: None (Preliminary result)   Collection Time: 12/28/16  9:03 PM  Result Value Ref Range Status   Specimen Description BLOOD RIGHT FOREARM  Final   Special Requests   Final    BOTTLES DRAWN AEROBIC AND ANAEROBIC Blood Culture adequate volume   Culture NO GROWTH 4 DAYS  Final   Report Status PENDING  Incomplete  Urine culture     Status: None     Collection Time: 12/28/16 10:19 PM  Result Value Ref Range Status   Specimen Description URINE, RANDOM  Final   Special Requests NONE  Final   Culture NO GROWTH  Final   Report Status 12/30/2016 FINAL  Final  Respiratory Panel by PCR     Status: None   Collection Time: 12/29/16  1:16 AM  Result Value Ref Range Status   Adenovirus NOT DETECTED NOT DETECTED Final   Coronavirus 229E NOT DETECTED NOT DETECTED Final   Coronavirus HKU1 NOT DETECTED NOT DETECTED Final   Coronavirus NL63 NOT DETECTED NOT DETECTED Final   Coronavirus OC43 NOT DETECTED NOT DETECTED Final   Metapneumovirus NOT DETECTED NOT DETECTED Final   Rhinovirus / Enterovirus NOT DETECTED NOT DETECTED Final   Influenza A NOT DETECTED NOT DETECTED Final   Influenza B NOT DETECTED NOT DETECTED Final   Parainfluenza Virus 1 NOT DETECTED NOT DETECTED Final   Parainfluenza Virus 2 NOT DETECTED NOT DETECTED Final   Parainfluenza Virus 3 NOT DETECTED NOT DETECTED Final   Parainfluenza Virus 4 NOT DETECTED NOT DETECTED Final   Respiratory Syncytial Virus NOT DETECTED NOT DETECTED Final   Bordetella pertussis NOT DETECTED NOT DETECTED Final   Chlamydophila pneumoniae NOT DETECTED NOT DETECTED Final   Mycoplasma pneumoniae NOT DETECTED NOT DETECTED Final  Culture, blood (routine x 2)     Status: None (Preliminary result)   Collection Time: 12/30/16  4:50 PM  Result Value Ref Range Status   Specimen Description BLOOD LEFT ARM  Final   Special Requests IN PEDIATRIC BOTTLE Blood Culture adequate volume  Final   Culture NO GROWTH 2 DAYS  Final   Report Status PENDING  Incomplete  Culture, blood (routine x 2)     Status: None (Preliminary result)   Collection Time: 12/30/16  4:55 PM  Result Value Ref Range Status   Specimen Description BLOOD LEFT HAND  Final   Special Requests IN PEDIATRIC BOTTLE Blood Culture adequate volume  Final   Culture NO GROWTH 2 DAYS  Final   Report Status PENDING  Incomplete  C difficile quick scan w  PCR reflex     Status: None   Collection Time: 12/31/16  1:20 AM  Result Value Ref Range Status   C Diff antigen NEGATIVE NEGATIVE Final   C Diff toxin NEGATIVE NEGATIVE Final   C Diff interpretation No C. difficile detected.  Final  Gastrointestinal Panel by PCR , Stool     Status: None   Collection Time: 12/31/16  1:20 AM  Result Value Ref Range Status   Campylobacter species NOT DETECTED  NOT DETECTED Final   Plesimonas shigelloides NOT DETECTED NOT DETECTED Final   Salmonella species NOT DETECTED NOT DETECTED Final   Yersinia enterocolitica NOT DETECTED NOT DETECTED Final   Vibrio species NOT DETECTED NOT DETECTED Final   Vibrio cholerae NOT DETECTED NOT DETECTED Final   Enteroaggregative E coli (EAEC) NOT DETECTED NOT DETECTED Final   Enteropathogenic E coli (EPEC) NOT DETECTED NOT DETECTED Final   Enterotoxigenic E coli (ETEC) NOT DETECTED NOT DETECTED Final   Shiga like toxin producing E coli (STEC) NOT DETECTED NOT DETECTED Final   Shigella/Enteroinvasive E coli (EIEC) NOT DETECTED NOT DETECTED Final   Cryptosporidium NOT DETECTED NOT DETECTED Final   Cyclospora cayetanensis NOT DETECTED NOT DETECTED Final   Entamoeba histolytica NOT DETECTED NOT DETECTED Final   Giardia lamblia NOT DETECTED NOT DETECTED Final   Adenovirus F40/41 NOT DETECTED NOT DETECTED Final   Astrovirus NOT DETECTED NOT DETECTED Final   Norovirus GI/GII NOT DETECTED NOT DETECTED Final   Rotavirus A NOT DETECTED NOT DETECTED Final   Sapovirus (I, II, IV, and V) NOT DETECTED NOT DETECTED Final    Raoul Pitch, [facility] [phone] pager   [phone] cell 01/01/2017

## 2017-01-01 NOTE — Progress Notes (Signed)
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 Orvan Falconerampbell ID next best regimen Doesn't need HIda scan at this time   Pleas KochJai Roanna Reaves, MD [facility] (P) [phone]

## 2017-01-02 LAB — CULTURE, BLOOD (ROUTINE X 2)
CULTURE: NO GROWTH
CULTURE: NO GROWTH
SPECIAL REQUESTS: ADEQUATE
SPECIAL REQUESTS: ADEQUATE

## 2017-01-02 LAB — RHEUMATOID FACTOR: Rhuematoid fact SerPl-aCnc: <10 IU/mL (ref 0.0–13.9)

## 2017-01-02 MED ORDER — QUININE SULFATE 324 MG PO CAPS: 648.0000 mg | ORAL_CAPSULE | Freq: Three times a day (TID) | ORAL | 0 refills | Status: DC

## 2017-01-02 MED ORDER — CLINDAMYCIN HCL 300 MG PO CAPS: 300.0000 mg | ORAL_CAPSULE | Freq: Three times a day (TID) | ORAL | 0 refills | Status: DC

## 2017-01-02 MED ORDER — AZITHROMYCIN 250 MG PO TABS: 250.0000 mg | ORAL_TABLET | Freq: Every day | ORAL | 0 refills | Status: DC

## 2017-01-02 MED ORDER — ATOVAQUONE 750 MG/5ML PO SUSP: 750.0000 mg | Freq: Two times a day (BID) | ORAL | 0 refills | Status: DC

## 2017-01-02 NOTE — Progress Notes (Addendum)
15:19 Mepron out of pocket cost 1720.00. Attempting to secure 340B cost with supervisors. MATCH override to 1500.00 available, faxed to [facility] Confirmed with Dr Luciana Axeomer that there is not a less expensive alternative.  16:53 After several attempts to locate quinine, Rx and Match [facility] who [facility] both quinine and clinda in stock. [facility]. Patient and charge nurse updated patient ready for DC.

## 2017-01-02 NOTE — Progress Notes (Signed)
D/w dr Nickie Retortcomer-ok for Quinine and Clinda for coverage.  Rx at nursing station.

## 2017-01-02 NOTE — Discharge Summary (Addendum)
Physician Discharge Summary  Roxana HiresMagdi Rebman NGE:[id number] DOB: [birth year] DOA: 12/28/2016  PCP: Patient, No Pcp Per  Admit date: 12/28/2016 Discharge date: 01/02/2017  Time spent: 25 minutes  Recommendations for Outpatient Follow-up:  1. Needs Atavaquone and Azithro for 9 more days 2. Will follow with [facility]--follow up as per Dr. Cindra Evesomer/Campbell who I will cc on this note  Discharge Diagnoses:  Principal Problem:   Fever of unknown origin Active Problems:   Sepsis (HCC)   Hypotension   Microcytic anemia   Hyponatremia   Upper abdominal pain   Nausea without vomiting   Discharge Condition: improved  Diet recommendation:  hh low salt  Filed Weights   12/28/16 2005 12/29/16 0832 12/29/16 1524  Weight: 68 kg (149 lb 14.6 oz) 68 kg (149 lb 14.6 oz) 68 kg (149 lb 14.6 oz)    History of present illness:  31 y/o su[place]ese male No known prior medical h/o Admitted 8/28 4 day h/o fever chills and severe weakness and malaise  No focal identifying source No neck stiffness, no blurred vision, no double vision-only symptom really is malaise and right-sided headache No ill contacts although has 3 children at home, [place] service No recent outside travel does not use illicit substances nor smoke  On admit septic with septic shock -some diarrhea All work up was neg Spiking fevers on 8/32 prompted search for more exotic illnesses Blood smear + for 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 Grayland OrmondKish--started on Jones BroomAtavaquone and Vito Backerszithro Had a dramatic recovery Wanted to go home 9/1 and d/w ID--although Not finalized, feel reasonable to send home with close [facility] follow up Tuesday this week [01/05/17]  Consultations:  ID  Discharge Exam: Vitals:   01/01/17 2206 01/02/17 0602  BP: (!) 84/55 (!) 89/62  Pulse: 83 80  Resp: 18 18  Temp: 98.2 F (36.8 C) (!) 97.4 F (36.3 C)  SpO2: 98% 99%    General:  Alert pleasant   Cardiovascular: s1 s 2no tachy Respiratory: chest clear epig tedner  Discharge Instructions   Discharge Instructions    Diet - low sodium heart healthy    Complete by:  As directed    Discharge instructions    Complete by:  As directed    Finish 9 more days of antibiotics COMPLETELY If your belly still hurts you might need a follow up Scan of your stomach-a HIDA scan--this is non emergent Best of luck   Increase activity slowly    Complete by:  As directed      Current Discharge Medication List    START taking these medications   Details  clindamycin (CLEOCIN) 300 MG capsule Take 1 capsule (300 mg total) by mouth 3 (three) times daily. Qty: 30 capsule, Refills: 0    quiNINE (QUALAQUIN) 324 MG capsule Take 2 capsules (648 mg total) by mouth 3 (three) times daily. Qty: 30 capsule, Refills: 0       No Known Allergies Follow-up Information    [facility]. Call on 01/06/2017.   Specialty:  Internal Medicine Why:  Please call to arranged appointment for post hosptal  follow up appointment on 01/06/2014  at 9:00 am Contact information: [address] [place] [id number] [phone]       Gardiner Barefootomer, Robert W, MD. Go on 01/05/2017.   Specialty:  Infectious Diseases Contact information: [address] [place] [address] [phone]  The results of significant diagnostics from this hospitalization (including imaging, microbiology, ancillary and laboratory) are listed below for reference.    Significant Diagnostic Studies: Dg Chest 2 View  Result Date: 12/28/2016 CLINICAL DATA:  Fever chills and weakness for 2 days. EXAM: CHEST  2 VIEW COMPARISON:  None. FINDINGS: The cardiomediastinal silhouette is unremarkable. There is no evidence of focal airspace disease, pulmonary edema, suspicious pulmonary nodule/mass, pleural effusion, or pneumothorax. No acute bony abnormalities are identified. IMPRESSION: No active  cardiopulmonary disease. Electronically Signed   By: Harmon Pier M.D.   On: 12/28/2016 22:17   Ct Abdomen W Contrast  Result Date: 12/30/2016 CLINICAL DATA:  Abdominal pain for 4 days. Clinically suspected gastroenteritis/colitis. EXAM: CT ABDOMEN WITH CONTRAST TECHNIQUE: Multidetector CT imaging of the abdomen was performed using the standard protocol following bolus administration of intravenous contrast. CONTRAST:  100 cc Isovue 300 intravenously. COMPARISON:  None. FINDINGS: Lower chest: Small bilateral pleural effusions with associated dependent subsegmental atelectasis. Hepatobiliary: Mild perivascular edema. The gallbladder is collapsed and therefore suboptimally evaluated. There is however pericholecystic edema. Pancreas: Unremarkable. No pancreatic ductal dilatation or surrounding inflammatory changes. Spleen: Normal in size without focal abnormality.  Splenial noted. Adrenals/Urinary Tract: Adrenal glands are unremarkable. Kidneys are normal, without renal calculi, focal lesion, or hydronephrosis. Bladder is unremarkable. Stomach/Bowel: Stomach is within normal limits. Appendix not entirely visualized due to collimation. No evidence of small bowel wall thickening, distention, or inflammatory changes, where visualized. Somewhat featureless appearance of the visualized portion of the colon. Vascular/Lymphatic: No significant vascular findings are present. Enlarged porta hepatitis lymph node measuring 17 mm in short axis. Shotty retroperitoneal lymph nodes in the upper abdomen with wispy appearance of the mesentery. Other: Small amount of free fluid in the abdomen, mainly perihepatic and within the right pericolic gutter. Musculoskeletal: No acute or significant osseous findings. IMPRESSION: Periportal and pericholecystic edema. Please correlate to liver function tests to exclude intrinsic liver disease. Small amount of free fluid in the abdomen, mostly in perihepatic location and along the right  pericolic gutter. Wispy appearance of the central mesentery of the upper abdomen, which may be seen with acute pancreatitis in addition to mesenteric edema. Abnormal lymph node in porta hepatis measures 1.7 cm in short axis. Other shotty retroperitoneal lymph nodes in the upper abdomen. Somewhat featureless appearance of the visualized portions of the colon, usually associated with inflammatory changes. Bilateral small pleural effusions and subsegmental dependent atelectasis. Electronically Signed   By: Ted Mcalpine M.D.   On: 12/30/2016 19:21   US Abdomen Complete  Result Date: 12/31/2016 CLINICAL DATA:  Pancreatitis.  Epigastric pain for 4 days. EXAM: ABDOMEN ULTRASOUND COMPLETE COMPARISON:  CT of the abdomen and pelvis 12/30/2016 FINDINGS: Gallbladder: The gallbladder is contracted and therefore suboptimally visualized. There is an apparent thickening of the gallbladder wall measuring 7 mm. There is a small amount of pericholecystic fluid. The sonographic Murphy's sign was reported by the sonographer as negative. Common bile duct: Diameter: 2.2 mm Liver: No focal lesion identified. Within normal limits in parenchymal echogenicity. Portal vein is patent on color Doppler imaging with normal direction of blood flow towards the liver. IVC: No abnormality visualized. Pancreas: Somewhat featureless appearance but normal thickness. Spleen: Size and appearance within normal limits. 3 cm splenule noted. Right Kidney: Length: 12.7 cm. Echogenicity within normal limits. No mass or hydronephrosis visualized. Left Kidney: Length: 13.3 cm. Echogenicity within normal limits. No mass or hydronephrosis visualized. Abdominal aorta: No aneurysm in the visualized portion of the aorta. Other findings:  Perihepatic ascites. Bilateral pleural effusions. IMPRESSION: 1. Contracted gallbladder with wall thickness of 7 mm. This wall thickening may be partially accounted for by the contracted state of the gallbladder, however  gallbladder wall edema is suspected given the degree of thickening. Small amount of pericholecystic fluid which may be reactive or due to presence of small amount of ascites. 2. Somewhat featureless appearance of the visualized portion of the pancreas, may represent parenchymal edema. 3. Normal appearance of the liver. 4. Small amount of perihepatic ascites. 5. Bilateral pleural effusions. Electronically Signed   By: Ted Mcalpine M.D.   On: 12/31/2016 17:56    Microbiology: Recent Results (from the past 240 hour(s))  Culture, blood (Routine x 2)     Status: None   Collection Time: 12/28/16  8:13 PM  Result Value Ref Range Status   Specimen Description BLOOD RIGHT ANTECUBITAL  Final   Special Requests   Final    BOTTLES DRAWN AEROBIC AND ANAEROBIC Blood Culture adequate volume   Culture NO GROWTH 5 DAYS  Final   Report Status 01/02/2017 FINAL  Final  Culture, blood (Routine x 2)     Status: None   Collection Time: 12/28/16  9:03 PM  Result Value Ref Range Status   Specimen Description BLOOD RIGHT FOREARM  Final   Special Requests   Final    BOTTLES DRAWN AEROBIC AND ANAEROBIC Blood Culture adequate volume   Culture NO GROWTH 5 DAYS  Final   Report Status 01/02/2017 FINAL  Final  Urine culture     Status: None   Collection Time: 12/28/16 10:19 PM  Result Value Ref Range Status   Specimen Description URINE, RANDOM  Final   Special Requests NONE  Final   Culture NO GROWTH  Final   Report Status 12/30/2016 FINAL  Final  Respiratory Panel by PCR     Status: None   Collection Time: 12/29/16  1:16 AM  Result Value Ref Range Status   Adenovirus NOT DETECTED NOT DETECTED Final   Coronavirus 229E NOT DETECTED NOT DETECTED Final   Coronavirus HKU1 NOT DETECTED NOT DETECTED Final   Coronavirus NL63 NOT DETECTED NOT DETECTED Final   Coronavirus OC43 NOT DETECTED NOT DETECTED Final   Metapneumovirus NOT DETECTED NOT DETECTED Final   Rhinovirus / Enterovirus NOT DETECTED NOT DETECTED  Final   Influenza A NOT DETECTED NOT DETECTED Final   Influenza B NOT DETECTED NOT DETECTED Final   Parainfluenza Virus 1 NOT DETECTED NOT DETECTED Final   Parainfluenza Virus 2 NOT DETECTED NOT DETECTED Final   Parainfluenza Virus 3 NOT DETECTED NOT DETECTED Final   Parainfluenza Virus 4 NOT DETECTED NOT DETECTED Final   Respiratory Syncytial Virus NOT DETECTED NOT DETECTED Final   Bordetella pertussis NOT DETECTED NOT DETECTED Final   Chlamydophila pneumoniae NOT DETECTED NOT DETECTED Final   Mycoplasma pneumoniae NOT DETECTED NOT DETECTED Final  Culture, blood (routine x 2)     Status: None (Preliminary result)   Collection Time: 12/30/16  4:50 PM  Result Value Ref Range Status   Specimen Description BLOOD LEFT ARM  Final   Special Requests IN PEDIATRIC BOTTLE Blood Culture adequate volume  Final   Culture NO GROWTH 3 DAYS  Final   Report Status PENDING  Incomplete  Culture, blood (routine x 2)     Status: None (Preliminary result)   Collection Time: 12/30/16  4:55 PM  Result Value Ref Range Status   Specimen Description BLOOD LEFT HAND  Final   Special Requests  IN PEDIATRIC BOTTLE Blood Culture adequate volume  Final   Culture NO GROWTH 3 DAYS  Final   Report Status PENDING  Incomplete  C difficile quick scan w PCR reflex     Status: None   Collection Time: 12/31/16  1:20 AM  Result Value Ref Range Status   C Diff antigen NEGATIVE NEGATIVE Final   C Diff toxin NEGATIVE NEGATIVE Final   C Diff interpretation No C. difficile detected.  Final  Gastrointestinal Panel by PCR , Stool     Status: None   Collection Time: 12/31/16  1:20 AM  Result Value Ref Range Status   Campylobacter species NOT DETECTED NOT DETECTED Final   Plesimonas shigelloides NOT DETECTED NOT DETECTED Final   Salmonella species NOT DETECTED NOT DETECTED Final   Yersinia enterocolitica NOT DETECTED NOT DETECTED Final   Vibrio species NOT DETECTED NOT DETECTED Final   Vibrio cholerae NOT DETECTED NOT DETECTED  Final   Enteroaggregative E coli (EAEC) NOT DETECTED NOT DETECTED Final   Enteropathogenic E coli (EPEC) NOT DETECTED NOT DETECTED Final   Enterotoxigenic E coli (ETEC) NOT DETECTED NOT DETECTED Final   Shiga like toxin producing E coli (STEC) NOT DETECTED NOT DETECTED Final   Shigella/Enteroinvasive E coli (EIEC) NOT DETECTED NOT DETECTED Final   Cryptosporidium NOT DETECTED NOT DETECTED Final   Cyclospora cayetanensis NOT DETECTED NOT DETECTED Final   Entamoeba histolytica NOT DETECTED NOT DETECTED Final   Giardia lamblia NOT DETECTED NOT DETECTED Final   Adenovirus F40/41 NOT DETECTED NOT DETECTED Final   Astrovirus NOT DETECTED NOT DETECTED Final   Norovirus GI/GII NOT DETECTED NOT DETECTED Final   Rotavirus A NOT DETECTED NOT DETECTED Final   Sapovirus (I, II, IV, and V) NOT DETECTED NOT DETECTED Final     Labs: Basic Metabolic Panel:  Recent Labs Lab 12/28/16 2018 12/29/16 0405 12/30/16 0535 12/30/16 1608 12/31/16 1021  NA 131* 138 139 138 139  K 3.3* 3.5 3.5 3.6 3.2*  CL 102 112* 110 109 109  CO2 20* 21* 23 24 26   GLUCOSE 105* 125* 138* 81 95  BUN 15 8 6 7 7   CREATININE 1.11 1.02 0.80 0.92 1.10  CALCIUM 8.8* 7.4* 8.1* 8.3* 7.8*   Liver Function Tests:  Recent Labs Lab 12/29/16 0405 12/30/16 0535 12/30/16 1608 12/31/16 1021 01/01/17 0911  AST 25 24 29 15 18   ALT 25 33 32 22 21  ALKPHOS 44 46 53 39 39  BILITOT 0.8 0.5 0.8 0.7 0.8  PROT 5.1* 5.5* 6.4* 5.2* 5.9*  ALBUMIN 2.6* 2.6* 3.0* 2.4* 2.6*    Recent Labs Lab 12/28/16 2143 12/30/16 1608 12/31/16 1021  LIPASE 25 27 29    No results for input(s): AMMONIA in the last 168 hours. CBC:  Recent Labs Lab 12/30/16 0535 12/30/16 1652 12/31/16 1021 01/01/17 0911 01/01/17 1314  WBC 4.2 4.7 3.8* 4.7 3.2*  NEUTROABS 2.9 3.6 1.9 3.7 2.4  HGB 10.3* 11.7* 10.2* 10.9* 10.8*  HCT 31.6* 36.7* 32.0* 33.8* 33.2*  MCV 77.5* 78.9 77.7* 77.3* 76.9*  PLT 113* 101* 87* 110* 86*   Cardiac Enzymes: No results  for input(s): CKTOTAL, CKMB, CKMBINDEX, TROPONINI in the last 168 hours. BNP: BNP (last 3 results) No results for input(s): BNP in the last 8760 hours.  ProBNP (last 3 results) No results for input(s): PROBNP in the last 8760 hours.  CBG: No results for input(s): GLUCAP in the last 168 hours.     Signed:  Rhetta Mura MD   [facility] 01/02/2017, 1:44 PM

## 2017-01-04 LAB — CULTURE, BLOOD (ROUTINE X 2)
CULTURE: NO GROWTH
Culture: NO GROWTH
Special Requests: ADEQUATE
Special Requests: ADEQUATE

## 2017-01-05 LAB — PATHOLOGIST SMEAR REVIEW

## 2017-01-05 LAB — ANTI-DNA ANTIBODY, DOUBLE-STRANDED: DS DNA AB: 2 [IU]/mL (ref 0–9)

## 2017-01-06 ENCOUNTER — Ambulatory Visit: Payer: Self-pay | Admitting: Internal Medicine

## 2017-01-08 ENCOUNTER — Telehealth: Payer: Self-pay | Admitting: Internal Medicine

## 2017-01-08 LAB — PARASITE EXAM, BLOOD: PARASITE EXAM, BLOOD: POSITIVE — AB

## 2017-01-08 NOTE — Telephone Encounter (Signed)
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  Patient has been on clindamycin and quinine.  Unfortunately he did not return for follow up.  Will call him to be sure he is treated and consider primaquine for eradication.

## 2017-01-12 ENCOUNTER — Encounter: Payer: Self-pay | Admitting: Internal Medicine

## 2017-01-12 ENCOUNTER — Ambulatory Visit (INDEPENDENT_AMBULATORY_CARE_PROVIDER_SITE_OTHER): Payer: Self-pay | Admitting: Internal Medicine

## 2017-01-12 DIAGNOSIS — B519 Plasmodium vivax malaria without complication: Secondary | ICD-10-CM

## 2017-01-12 DIAGNOSIS — D509 Iron deficiency anemia, unspecified: Secondary | ICD-10-CM

## 2017-01-12 DIAGNOSIS — Z5181 Encounter for therapeutic drug level monitoring: Secondary | ICD-10-CM

## 2017-01-12 MED ORDER — PRIMAQUINE PHOSPHATE 26.3 MG PO TABS: 30.0000 mg | ORAL_TABLET | Freq: Every day | ORAL | 0 refills | Status: AC

## 2017-01-12 MED ORDER — CHLOROQUINE PHOSPHATE 500 MG PO TABS: 500.0000 mg | ORAL_TABLET | Freq: Every day | ORAL | 0 refills | Status: AC

## 2017-01-12 NOTE — Assessment & Plan Note (Signed)
I will check a G6PD since he will be starting primaquine

## 2017-01-12 NOTE — Progress Notes (Signed)
   Subjective:    Patient ID: George Murillo, male    DOB: 1985-07-24, 31 y.o.   MRN: [id number]  HPI Here for hsfu. 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  He was treated with clindamycin and atovaquone and defervesced and sent out on clindamycin and quinine.  He has done well since but offical read of the smear most c/w P vivax.  He is still having some hypotension and dizziness, which has been a chronic problem.  No fever or chills.  Has been taking medication.  No associated rash.    Review of Systems  Constitutional: Negative for chills, fatigue and fever.  Gastrointestinal: Negative for abdominal pain and diarrhea.  Skin: Negative for rash.  Neurological: Positive for light-headedness.       Objective:   Physical Exam  Constitutional: He appears well-developed and well-nourished. No distress.  HENT:  Mouth/Throat: No oropharyngeal exudate.  Eyes: No scleral icterus.  Cardiovascular: Normal rate, regular rhythm and normal heart sounds.   No murmur heard. Pulmonary/Chest: Effort normal and breath sounds normal. No respiratory distress.  Lymphadenopathy:    He has no cervical adenopathy.  Skin: No rash noted.   SH: + tobacco       Assessment & Plan:

## 2017-01-12 NOTE — Assessment & Plan Note (Signed)
I will start him on chloroquine and then primaquine for eradication

## 2017-01-12 NOTE — Assessment & Plan Note (Signed)
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  Will follow.

## 2017-01-12 NOTE — Patient Instructions (Signed)
Stop clindamycin and quinine

## 2017-01-13 LAB — CBC
HEMATOCRIT: 34.3 % — AB (ref 38.5–50.0)
Hemoglobin: 11.4 g/dL — ABNORMAL LOW (ref 13.2–17.1)
MCH: 25.7 pg — ABNORMAL LOW (ref 27.0–33.0)
MCHC: 33.2 g/dL (ref 32.0–36.0)
MCV: 77.3 fL — ABNORMAL LOW (ref 80.0–100.0)
MPV: 9.6 fL (ref 7.5–12.5)
Platelets: 339 10*3/uL (ref 140–400)
RBC: 4.44 10*6/uL (ref 4.20–5.80)
RDW: 15.6 % — AB (ref 11.0–15.0)
WBC: 7.6 10*3/uL (ref 3.8–10.8)

## 2017-01-13 LAB — GLUCOSE 6 PHOSPHATE DEHYDROGENASE: G-6PDH: 23 U/g Hgb — ABNORMAL HIGH (ref 7.0–20.5)

## 2017-02-04 ENCOUNTER — Ambulatory Visit: Payer: Medicaid Other | Admitting: Internal Medicine

## 2017-03-10 ENCOUNTER — Other Ambulatory Visit: Payer: Self-pay

## 2017-03-10 ENCOUNTER — Emergency Department (HOSPITAL_COMMUNITY): Payer: No Typology Code available for payment source

## 2017-03-10 ENCOUNTER — Emergency Department (HOSPITAL_COMMUNITY)
Admission: EM | Admit: 2017-03-10 | Discharge: 2017-03-10 | Disposition: A | Discharge status: Home / Self Care | Payer: No Typology Code available for payment source | Attending: Emergency Medicine | Admitting: Emergency Medicine

## 2017-03-10 ENCOUNTER — Encounter (HOSPITAL_COMMUNITY): Payer: Self-pay | Admitting: *Deleted

## 2017-03-10 DIAGNOSIS — M545 Low back pain, unspecified: Secondary | ICD-10-CM

## 2017-03-10 DIAGNOSIS — Z79899 Other long term (current) drug therapy: Secondary | ICD-10-CM | POA: Diagnosis not present

## 2017-03-10 DIAGNOSIS — F17290 Nicotine dependence, other tobacco product, uncomplicated: Secondary | ICD-10-CM | POA: Insufficient documentation

## 2017-03-10 DIAGNOSIS — M2560 Stiffness of unspecified joint, not elsewhere classified: Secondary | ICD-10-CM | POA: Diagnosis not present

## 2017-03-10 DIAGNOSIS — M542 Cervicalgia: Secondary | ICD-10-CM | POA: Insufficient documentation

## 2017-03-10 DIAGNOSIS — M546 Pain in thoracic spine: Secondary | ICD-10-CM | POA: Diagnosis not present

## 2017-03-10 DIAGNOSIS — I10 Essential (primary) hypertension: Secondary | ICD-10-CM | POA: Diagnosis not present

## 2017-03-10 MED ORDER — HYDROCODONE-ACETAMINOPHEN 5-325 MG PO TABS
1.0000 | ORAL_TABLET | Freq: Once | ORAL | Status: AC
  Administered 2017-03-10: 1 via ORAL
  Filled 2017-03-10: qty 1

## 2017-03-10 MED ORDER — CYCLOBENZAPRINE HCL 10 MG PO TABS: 10.0000 mg | ORAL_TABLET | Freq: Two times a day (BID) | ORAL | 0 refills | Status: DC | PRN
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
## 2017-03-10 NOTE — Discharge Instructions (Signed)
The x-rays of your neck and back do not show fracture or acute abnormality.  The pain you are experiencing is consistent with muscle pain.  Please take 600 mg ibuprofen every 6 hours as needed for pain.  I have also written you a prescription for a muscle relaxer medicine called Flexeril.  Medicine can make you drowsy so please do not drive or drink alcohol while taking this medicine.  Please use the 888 number this packet to schedule an appointment with a primary doctor to become established and also for follow-up of your back pain if it does not improve in a week.  Please apply heat to the lower back to help with your symptoms.  Return to the emergency department if you develop back pain in which you can no longer feel your feet, back pain in which she you lose control of your bowels or bladder, back pain with a fever greater than 100.4 F.  Please also return for any new or worsening symptoms.
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
## 2018-09-18 ENCOUNTER — Encounter (HOSPITAL_COMMUNITY): Payer: Self-pay

## 2018-09-18 ENCOUNTER — Emergency Department (HOSPITAL_COMMUNITY)
Admission: EM | Admit: 2018-09-18 | Discharge: 2018-09-18 | Disposition: A | Discharge status: Home / Self Care | Payer: BLUE CROSS/BLUE SHIELD | Attending: Emergency Medicine | Admitting: Emergency Medicine

## 2018-09-18 ENCOUNTER — Other Ambulatory Visit: Payer: Self-pay

## 2018-09-18 DIAGNOSIS — F17200 Nicotine dependence, unspecified, uncomplicated: Secondary | ICD-10-CM | POA: Diagnosis not present

## 2018-09-18 DIAGNOSIS — M545 Low back pain, unspecified: Secondary | ICD-10-CM

## 2018-09-18 DIAGNOSIS — Z79899 Other long term (current) drug therapy: Secondary | ICD-10-CM | POA: Diagnosis not present

## 2018-09-18 DIAGNOSIS — G8929 Other chronic pain: Secondary | ICD-10-CM | POA: Insufficient documentation

## 2018-09-18 DIAGNOSIS — R0789 Other chest pain: Secondary | ICD-10-CM

## 2018-09-18 MED ORDER — CYCLOBENZAPRINE HCL 10 MG PO TABS: 10.0000 mg | ORAL_TABLET | Freq: Two times a day (BID) | ORAL | 0 refills | Status: DC | PRN

## 2018-09-18 MED ORDER — LIDOCAINE 5 % EX PTCH: 1.0000 | MEDICATED_PATCH | CUTANEOUS | 0 refills | Status: DC

## 2018-09-18 MED ORDER — ACETAMINOPHEN 500 MG PO TABS: 500.0000 mg | ORAL_TABLET | Freq: Four times a day (QID) | ORAL | 0 refills | Status: AC | PRN

## 2018-09-18 MED ORDER — PREDNISONE 10 MG (21) PO TBPK: ORAL_TABLET | Freq: Every day | ORAL | 0 refills | Status: DC
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
## 2018-09-18 NOTE — ED Notes (Signed)
Discharge instructions discussed with Pt. Pt verbalized understanding. Pt stable and ambulatory.    
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
## 2018-09-18 NOTE — Discharge Instructions (Signed)
1. Medications: Take steroid taper as prescribed with food to avoid upset stomach issues.  Do not take ibuprofen, Advil, Aleve, or Motrin while taking this medicine.  You may take [phone] mg of Tylenol every 6 hours as needed for pain. Do not exceed 4000 mg of Tylenol daily.  You can take Flexeril as needed for muscle relaxation but this medication may make you drowsy so do not drive, drink alcohol, operate heavy machinery, or make important decisions while you are using this medicine.  I typically recommend only taking this medicine at night.  You can also cut these tablets in half if they are very strong.  You can also apply lidocaine patches to areas of pain. 2. Treatment: rest, drink plenty of fluids, alternate ice or heat whichever feels best, gentle stretching (see attached exercises, but you can also YouTube search low back or chest wall physical therapy exercises).   3. Follow Up: Please followup with your PCP in 1 week if no improvement for discussion of your diagnoses and further evaluation after today's visit; call the number on the back of your insurance card or go online to see providers in her area that are taking new patients; Please return to the ER for worsening symptoms or other concerns such as worsening swelling, redness of the skin, fevers, loss of pulses, or loss of feeling

## 2019-07-26 ENCOUNTER — Other Ambulatory Visit: Payer: Self-pay

## 2019-07-26 ENCOUNTER — Emergency Department (HOSPITAL_COMMUNITY)
Admission: EM | Admit: 2019-07-26 | Discharge: 2019-07-26 | Disposition: A | Discharge status: Home / Self Care | Payer: 59 | Attending: Emergency Medicine | Admitting: Emergency Medicine

## 2019-07-26 DIAGNOSIS — R040 Epistaxis: Secondary | ICD-10-CM | POA: Insufficient documentation

## 2019-07-26 DIAGNOSIS — F17290 Nicotine dependence, other tobacco product, uncomplicated: Secondary | ICD-10-CM | POA: Diagnosis not present

## 2019-07-26 MED ORDER — AFRIN NASAL SPRAY 0.05 % NA SOLN: 1.0000 | Freq: Two times a day (BID) | NASAL | 0 refills | Status: AC

## 2019-07-26 NOTE — ED Provider Notes (Signed)
[place] EMERGENCY DEPARTMENT Provider Note   CSN: [id number] Arrival date & time: 07/26/19  1217     History Chief Complaint  Patient presents with  . Epistaxis    George Murillo is a 34 y.o. male who presents with epistaxis.  Patient states that yesterday he woke up and sneezed and then his nose was bleeding.  He states that the bleeding lasted couple of minutes and then he was able to go to work.  Today when he woke up his nose started bleeding again.  He states it was profuse and he was unable unable to control the bleeding for about 1 hour.  He decided to come to the emergency department.  Since then the bleeding has resolved.  He cannot tell me if it was coming from one nostril or the other.  He reports chronic sinus problems and nasal congestion and follows with ENT at [facility].  He recently saw his ENT doctor and was prescribed a steroid spray and antibiotics.  He states he taken this without significant relief and so stopped this medicine.  He was told by the ENT doctor that he may need surgery to fix the problem but he has not scheduled this yet.  Per chart review a nasal endoscopy was performed which showed marked nasal obstruction, septal deviation, nasal polyps.  HPI     Past Medical History:  Diagnosis Date  . Fever of unknown origin 12/2016  . Microcytic anemia     Patient Active Problem List   Diagnosis Date Noted  . 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 Medication monitoring encounter 01/12/2017  . Hypotension 12/29/2016  . Microcytic anemia 12/29/2016    Past Surgical History:  Procedure Laterality Date  . NO PAST SURGERIES         Family History  Problem Relation Age of Onset  . Heart disease Neg Hx     Social History   Tobacco Use  . Smoking status: Current Every Day Smoker    Packs/day: 0.20    Types: E-cigarettes  . Smokeless tobacco: Never Used  Substance Use Topics  . Alcohol use: No  . Drug use: No    Home  Medications Prior to Admission medications   Medication Sig Start Date End Date Taking? Authorizing Provider  acetaminophen (TYLENOL) 500 MG tablet Take 1 tablet (500 mg total) by mouth every 6 (six) hours as needed. 09/18/18   Fawze, Mina A, PA-C  chloroquine (ARALEN) 500 MG tablet Take 1 tablet (500 mg total) by mouth daily. Take 2 tabs once, then 1 tab after 6 hours, 24 hours and 48 hours 01/12/17   Comer, Okey Regal, MD  cyclobenzaprine (FLEXERIL) 10 MG tablet Take 1 tablet (10 mg total) by mouth 2 (two) times daily as needed. 09/18/18   Fawze, Mina A, PA-C  lidocaine (LIDODERM) 5 % Place 1 patch onto the skin daily. Remove & Discard patch within 12 hours or as directed by MD 09/18/18   Rodell Perna A, PA-C  predniSONE (STERAPRED UNI-PAK 21 TAB) 10 MG (21) TBPK tablet Take by mouth daily. Take 6 tabs by mouth daily  for 2 days, then 5 tabs for 2 days, then 4 tabs for 2 days, then 3 tabs for 2 days, 2 tabs for 2 days, then 1 tab by mouth daily for 2 days 09/18/18   Rodell Perna A, PA-C  primaquine 26.3 MG tablet Take 2 tablets (30 mg total) by mouth daily. For 14 days; start after completing  the chloroquine; for eradication 01/12/17   Comer, Belia Heman, MD    Allergies    Patient has no known allergies.  Review of Systems   Review of Systems  Constitutional: Negative for fever.  HENT: Positive for congestion, nosebleeds and sinus pressure.     Physical Exam Updated Vital Signs BP 106/73 (BP Location: Right Arm)   Pulse 91   Temp 98 F (36.7 C) (Oral)   Resp 14   SpO2 97%   Physical Exam Vitals and nursing note reviewed.  Constitutional:      General: He is not in acute distress.    Appearance: Normal appearance. He is well-developed. He is not ill-appearing.  HENT:     Head: Normocephalic and atraumatic.     Nose: Mucosal edema (significant edema bilaterally) and congestion present.     Right Nostril: Occlusion present. No epistaxis.     Left Nostril: Occlusion present. No epistaxis.      Right Turbinates: Enlarged.     Left Turbinates: Enlarged.  Eyes:     General: No scleral icterus.       Right eye: No discharge.        Left eye: No discharge.     Conjunctiva/sclera: Conjunctivae normal.     Pupils: Pupils are equal, round, and reactive to light.  Cardiovascular:     Rate and Rhythm: Normal rate.  Pulmonary:     Effort: Pulmonary effort is normal. No respiratory distress.  Abdominal:     General: There is no distension.  Musculoskeletal:     Cervical back: Normal range of motion.  Skin:    General: Skin is warm and dry.  Neurological:     Mental Status: He is alert and oriented to person, place, and time.  Psychiatric:        Behavior: Behavior normal.     ED Results / Procedures / Treatments   Labs (all labs ordered are listed, but only abnormal results are displayed) Labs Reviewed - No data to display  EKG None  Radiology No results found.  Procedures Procedures (including critical care time)  Medications Ordered in ED Medications - No data to display  ED Course  I have reviewed the triage vital signs and the nursing notes.  Pertinent labs & imaging results that were available during my care of the patient were reviewed by me and considered in my medical decision making (see chart for details).  34 year old male with chronic nasal congestion and sinusitis presents with epistaxis.  Unclear if it anterior versus posterior.  I think symptoms are most likely from chronic irritation of the nasal mucosa and could be exacerbated by recent use of steroid nasal spray.  At the time of my evaluation the epistaxis has resolved.  Anatomy was difficult to examine due to marked swelling.  We will give him some Afrin to use as needed for recurrent epistaxis and he was advised to hold pressure at the nasal septum for at least 10 to 15 minutes next time this happened to help get bleeding resolved.  He was encouraged to follow-up with his ENT doctor.  MDM  Rules/Calculators/A&P                      Final Clinical Impression(s) / ED Diagnoses Final diagnoses:  Epistaxis    Rx / DC Orders ED Discharge Orders    None       Bethel Born, PA-C 07/26/19 1409    Gwyneth Sprout, MD  07/26/19 1530  

## 2019-07-26 NOTE — ED Notes (Signed)
Pt d/c home per MD order. Discharge summary reviewed, pt verbalizes understanding. Ambulatory off unit. No s/s of distress noted.

## 2019-07-26 NOTE — ED Triage Notes (Signed)
Pt reports epistaxis yesterday and today. States yesterday it only lasted a few minutes and todays episode lasted approx 1 hour.

## 2019-07-26 NOTE — Discharge Instructions (Signed)
Use Afrin if you nose bleeds in both nostrils Hold pressure by pinching the nose for 10 minutes without letting go and leaning forward Make a follow up appointment with your ENT doctor
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
## 2019-09-07 ENCOUNTER — Ambulatory Visit (INDEPENDENT_AMBULATORY_CARE_PROVIDER_SITE_OTHER): Payer: 59 | Admitting: Otolaryngology

## 2019-09-08 ENCOUNTER — Ambulatory Visit (INDEPENDENT_AMBULATORY_CARE_PROVIDER_SITE_OTHER): Payer: 59 | Admitting: Otolaryngology

## 2019-09-08 ENCOUNTER — Other Ambulatory Visit: Payer: Self-pay

## 2019-09-08 VITALS — Temp 97.9°F

## 2019-09-08 DIAGNOSIS — J310 Chronic rhinitis: Secondary | ICD-10-CM | POA: Diagnosis not present

## 2019-09-08 DIAGNOSIS — J3489 Other specified disorders of nose and nasal sinuses: Secondary | ICD-10-CM | POA: Diagnosis not present
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
## 2019-09-13 ENCOUNTER — Other Ambulatory Visit (INDEPENDENT_AMBULATORY_CARE_PROVIDER_SITE_OTHER): Payer: Self-pay

## 2019-11-08 ENCOUNTER — Telehealth (INDEPENDENT_AMBULATORY_CARE_PROVIDER_SITE_OTHER): Payer: Self-pay

## 2024-01-31 NOTE — Telephone Encounter (Signed)
 error
